# Patient Record
Sex: Female | Born: 1995 | Race: White | Hispanic: No | Marital: Single | Smoking: Never smoker
Health system: Southern US, Community
[De-identification: ages and names within clinical notes are randomized; demographics above are authoritative.]

## PROBLEM LIST (undated history)

## (undated) DIAGNOSIS — J189 Pneumonia, unspecified organism: Secondary | ICD-10-CM

## (undated) DIAGNOSIS — K5909 Other constipation: Secondary | ICD-10-CM

## (undated) DIAGNOSIS — J302 Other seasonal allergic rhinitis: Secondary | ICD-10-CM

## (undated) DIAGNOSIS — L0291 Cutaneous abscess, unspecified: Secondary | ICD-10-CM

---

## 2016-11-06 DIAGNOSIS — K353 Acute appendicitis with localized peritonitis: Secondary | ICD-10-CM | POA: Diagnosis not present

## 2016-11-06 DIAGNOSIS — R739 Hyperglycemia, unspecified: Secondary | ICD-10-CM | POA: Diagnosis present

## 2016-11-06 DIAGNOSIS — Z6837 Body mass index (BMI) 37.0-37.9, adult: Secondary | ICD-10-CM

## 2016-11-06 DIAGNOSIS — J302 Other seasonal allergic rhinitis: Secondary | ICD-10-CM | POA: Diagnosis present

## 2016-11-06 DIAGNOSIS — K5900 Constipation, unspecified: Secondary | ICD-10-CM | POA: Diagnosis present

## 2016-11-06 DIAGNOSIS — N739 Female pelvic inflammatory disease, unspecified: Secondary | ICD-10-CM | POA: Diagnosis present

## 2016-11-07 ENCOUNTER — Emergency Department (HOSPITAL_COMMUNITY): Payer: PRIVATE HEALTH INSURANCE

## 2016-11-07 ENCOUNTER — Inpatient Hospital Stay (HOSPITAL_COMMUNITY)
Admission: EM | Admit: 2016-11-07 | Discharge: 2016-11-14 | DRG: 373 | Disposition: A | Discharge status: Home / Self Care | Payer: PRIVATE HEALTH INSURANCE | Attending: Surgery | Admitting: Surgery

## 2016-11-07 ENCOUNTER — Encounter (HOSPITAL_COMMUNITY): Payer: Self-pay

## 2016-11-07 DIAGNOSIS — J302 Other seasonal allergic rhinitis: Secondary | ICD-10-CM

## 2016-11-07 DIAGNOSIS — K3532 Acute appendicitis with perforation and localized peritonitis, without abscess: Secondary | ICD-10-CM

## 2016-11-07 DIAGNOSIS — K5909 Other constipation: Secondary | ICD-10-CM

## 2016-11-07 DIAGNOSIS — K353 Acute appendicitis with localized peritonitis: Secondary | ICD-10-CM | POA: Diagnosis present

## 2016-11-07 DIAGNOSIS — Z6837 Body mass index (BMI) 37.0-37.9, adult: Secondary | ICD-10-CM | POA: Diagnosis not present

## 2016-11-07 DIAGNOSIS — N739 Female pelvic inflammatory disease, unspecified: Secondary | ICD-10-CM | POA: Diagnosis present

## 2016-11-07 DIAGNOSIS — R109 Unspecified abdominal pain: Secondary | ICD-10-CM

## 2016-11-07 DIAGNOSIS — K3533 Acute appendicitis with perforation and localized peritonitis, with abscess: Secondary | ICD-10-CM | POA: Diagnosis present

## 2016-11-07 DIAGNOSIS — R739 Hyperglycemia, unspecified: Secondary | ICD-10-CM | POA: Diagnosis present

## 2016-11-07 DIAGNOSIS — K651 Peritoneal abscess: Secondary | ICD-10-CM

## 2016-11-07 DIAGNOSIS — K5900 Constipation, unspecified: Secondary | ICD-10-CM | POA: Diagnosis present

## 2016-11-07 HISTORY — DX: Other seasonal allergic rhinitis: J30.2

## 2016-11-07 HISTORY — DX: Other constipation: K59.09

## 2016-11-07 LAB — COMPREHENSIVE METABOLIC PANEL
ALBUMIN: 4 g/dL (ref 3.5–5.0)
ALT: 16 U/L (ref 14–54)
AST: 17 U/L (ref 15–41)
Alkaline Phosphatase: 86 U/L (ref 38–126)
Anion gap: 12 (ref 5–15)
BILIRUBIN TOTAL: 1.1 mg/dL (ref 0.3–1.2)
BUN: 13 mg/dL (ref 6–20)
CHLORIDE: 104 mmol/L (ref 101–111)
CO2: 21 mmol/L — ABNORMAL LOW (ref 22–32)
CREATININE: 0.78 mg/dL (ref 0.44–1.00)
Calcium: 9.3 mg/dL (ref 8.9–10.3)
GFR calc Af Amer: >60 mL/min (ref >60)
GLUCOSE: 136 mg/dL — AB (ref 65–99)
Potassium: 4 mmol/L (ref 3.5–5.1)
Sodium: 137 mmol/L (ref 135–145)
Total Protein: 8 g/dL (ref 6.5–8.1)

## 2016-11-07 LAB — URINALYSIS, ROUTINE W REFLEX MICROSCOPIC
Bilirubin Urine: NEGATIVE
Glucose, UA: NEGATIVE mg/dL
Ketones, ur: 20 mg/dL — AB
LEUKOCYTES UA: NEGATIVE
Nitrite: NEGATIVE
PH: 6 (ref 5.0–8.0)
Protein, ur: NEGATIVE mg/dL

## 2016-11-07 LAB — CBC
HEMATOCRIT: 36.3 % (ref 36.0–46.0)
Hemoglobin: 12.3 g/dL (ref 12.0–15.0)
MCH: 29.5 pg (ref 26.0–34.0)
MCHC: 33.9 g/dL (ref 30.0–36.0)
MCV: 87.1 fL (ref 78.0–100.0)
PLATELETS: 248 10*3/uL (ref 150–400)
RBC: 4.17 MIL/uL (ref 3.87–5.11)
RDW: 13 % (ref 11.5–15.5)
WBC: 16.8 10*3/uL — AB (ref 4.0–10.5)

## 2016-11-07 LAB — I-STAT BETA HCG BLOOD, ED (MC, WL, AP ONLY)

## 2016-11-07 LAB — LIPASE, BLOOD: LIPASE: 18 U/L (ref 11–51)

## 2016-11-07 MED ORDER — FLUTICASONE PROPIONATE 50 MCG/ACT NA SUSP
1.0000 | Freq: Every day | NASAL | Status: DC
  Administered 2016-11-07 – 2016-11-14 (×8): 1 via NASAL
  Filled 2016-11-07: qty 16

## 2016-11-07 MED ORDER — DIPHENHYDRAMINE HCL 50 MG/ML IJ SOLN: 12.5000 mg | Freq: Four times a day (QID) | INTRAMUSCULAR | Status: DC | PRN

## 2016-11-07 MED ORDER — MORPHINE SULFATE (PF) 4 MG/ML IV SOLN
4.0000 mg | Freq: Once | INTRAVENOUS | Status: AC
  Administered 2016-11-07: 4 mg via INTRAVENOUS
  Filled 2016-11-07: qty 1

## 2016-11-07 MED ORDER — GLYCERIN (LAXATIVE) 2.1 G RE SUPP
1.0000 | RECTAL | Status: DC | PRN
  Filled 2016-11-07: qty 1

## 2016-11-07 MED ORDER — IOPAMIDOL (ISOVUE-300) INJECTION 61%
INTRAVENOUS | Status: AC
  Administered 2016-11-07: 100 mL via INTRAVENOUS
  Filled 2016-11-07: qty 100

## 2016-11-07 MED ORDER — DEXTROSE 5 % IV SOLN
2.0000 g | Freq: Once | INTRAVENOUS | Status: DC
  Administered 2016-11-07: 2 g via INTRAVENOUS
  Filled 2016-11-07: qty 2

## 2016-11-07 MED ORDER — SIMETHICONE 80 MG PO CHEW: 40.0000 mg | CHEWABLE_TABLET | Freq: Four times a day (QID) | ORAL | Status: DC | PRN

## 2016-11-07 MED ORDER — MAGIC MOUTHWASH
15.0000 mL | Freq: Four times a day (QID) | ORAL | Status: DC | PRN
  Administered 2016-11-11: 15 mL via ORAL
  Filled 2016-11-07 (×3): qty 15

## 2016-11-07 MED ORDER — ONDANSETRON HCL 4 MG/2ML IJ SOLN: 4.0000 mg | Freq: Four times a day (QID) | INTRAMUSCULAR | Status: DC | PRN

## 2016-11-07 MED ORDER — ACETAMINOPHEN 325 MG PO TABS
650.0000 mg | ORAL_TABLET | Freq: Four times a day (QID) | ORAL | Status: DC | PRN
  Administered 2016-11-07 – 2016-11-08 (×4): 650 mg via ORAL
  Filled 2016-11-07 (×4): qty 2

## 2016-11-07 MED ORDER — LACTATED RINGERS IV SOLN
INTRAVENOUS | Status: DC
  Administered 2016-11-07: 1000 mL via INTRAVENOUS
  Administered 2016-11-08: 100 mL/h via INTRAVENOUS
  Administered 2016-11-08 – 2016-11-09 (×2): via INTRAVENOUS

## 2016-11-07 MED ORDER — LACTATED RINGERS IV BOLUS (SEPSIS): 1000.0000 mL | Freq: Three times a day (TID) | INTRAVENOUS | Status: AC | PRN

## 2016-11-07 MED ORDER — ACETAMINOPHEN 650 MG RE SUPP: 650.0000 mg | Freq: Four times a day (QID) | RECTAL | Status: DC | PRN

## 2016-11-07 MED ORDER — BISACODYL 10 MG RE SUPP: 10.0000 mg | Freq: Two times a day (BID) | RECTAL | Status: DC | PRN

## 2016-11-07 MED ORDER — MENTHOL 3 MG MT LOZG: 1.0000 | LOZENGE | OROMUCOSAL | Status: DC | PRN

## 2016-11-07 MED ORDER — PIPERACILLIN-TAZOBACTAM 3.375 G IVPB
3.3750 g | Freq: Three times a day (TID) | INTRAVENOUS | Status: DC
  Administered 2016-11-07 – 2016-11-10 (×9): 3.375 g via INTRAVENOUS
  Filled 2016-11-07 (×10): qty 50

## 2016-11-07 MED ORDER — PIPERACILLIN-TAZOBACTAM 3.375 G IVPB
3.3750 g | Freq: Once | INTRAVENOUS | Status: AC
  Administered 2016-11-07: 3.375 g via INTRAVENOUS
  Filled 2016-11-07: qty 50

## 2016-11-07 MED ORDER — ALUM & MAG HYDROXIDE-SIMETH 200-200-20 MG/5ML PO SUSP: 30.0000 mL | Freq: Four times a day (QID) | ORAL | Status: DC | PRN

## 2016-11-07 MED ORDER — METHOCARBAMOL 1000 MG/10ML IJ SOLN
1000.0000 mg | Freq: Four times a day (QID) | INTRAVENOUS | Status: DC | PRN
  Filled 2016-11-07: qty 10

## 2016-11-07 MED ORDER — GLYCERIN (ADULT) 2 G RE SUPP: 1.0000 | RECTAL | Status: DC | PRN

## 2016-11-07 MED ORDER — HYDROMORPHONE HCL 1 MG/ML IJ SOLN
0.5000 mg | INTRAMUSCULAR | Status: DC | PRN
  Administered 2016-11-07 – 2016-11-09 (×9): 0.5 mg via INTRAVENOUS
  Filled 2016-11-07 (×9): qty 0.5

## 2016-11-07 MED ORDER — ONDANSETRON 4 MG PO TBDP: 4.0000 mg | ORAL_TABLET | Freq: Four times a day (QID) | ORAL | Status: DC | PRN

## 2016-11-07 MED ORDER — METOCLOPRAMIDE HCL 5 MG/ML IJ SOLN: 5.0000 mg | Freq: Four times a day (QID) | INTRAMUSCULAR | Status: DC | PRN

## 2016-11-07 MED ORDER — METOPROLOL TARTRATE 5 MG/5ML IV SOLN: 5.0000 mg | Freq: Four times a day (QID) | INTRAVENOUS | Status: DC | PRN

## 2016-11-07 MED ORDER — HYDRALAZINE HCL 20 MG/ML IJ SOLN: 10.0000 mg | INTRAMUSCULAR | Status: DC | PRN

## 2016-11-07 MED ORDER — IOPAMIDOL (ISOVUE-300) INJECTION 61%
100.0000 mL | Freq: Once | INTRAVENOUS | Status: AC | PRN
  Administered 2016-11-07: 100 mL via INTRAVENOUS

## 2016-11-07 MED ORDER — HYDRALAZINE HCL 20 MG/ML IJ SOLN: 5.0000 mg | Freq: Four times a day (QID) | INTRAMUSCULAR | Status: DC | PRN

## 2016-11-07 MED ORDER — LACTATED RINGERS IV BOLUS (SEPSIS)
1000.0000 mL | Freq: Once | INTRAVENOUS | Status: AC
  Administered 2016-11-07: 1000 mL via INTRAVENOUS

## 2016-11-07 MED ORDER — LIP MEDEX EX OINT
1.0000 "application " | TOPICAL_OINTMENT | Freq: Two times a day (BID) | CUTANEOUS | Status: DC
  Administered 2016-11-07 – 2016-11-14 (×13): 1 via TOPICAL
  Filled 2016-11-07 (×3): qty 7

## 2016-11-07 MED ORDER — PROCHLORPERAZINE EDISYLATE 5 MG/ML IJ SOLN: 5.0000 mg | INTRAMUSCULAR | Status: DC | PRN

## 2016-11-07 MED ORDER — SODIUM CHLORIDE 0.9 % IV BOLUS (SEPSIS)
1000.0000 mL | Freq: Once | INTRAVENOUS | Status: AC
Start: 2016-11-07 — End: 2016-11-07
  Administered 2016-11-07: 1000 mL via INTRAVENOUS

## 2016-11-07 MED ORDER — HYDROMORPHONE HCL 1 MG/ML IJ SOLN: 0.5000 mg | INTRAMUSCULAR | Status: DC | PRN

## 2016-11-07 MED ORDER — METRONIDAZOLE IN NACL 5-0.79 MG/ML-% IV SOLN
500.0000 mg | Freq: Once | INTRAVENOUS | Status: DC
  Filled 2016-11-07: qty 100

## 2016-11-07 MED ORDER — ENOXAPARIN SODIUM 40 MG/0.4ML ~~LOC~~ SOLN
40.0000 mg | SUBCUTANEOUS | Status: DC
  Administered 2016-11-07 – 2016-11-12 (×6): 40 mg via SUBCUTANEOUS
  Filled 2016-11-07 (×6): qty 0.4

## 2016-11-07 MED ORDER — DIPHENHYDRAMINE HCL 12.5 MG/5ML PO ELIX: 12.5000 mg | ORAL_SOLUTION | Freq: Four times a day (QID) | ORAL | Status: DC | PRN

## 2016-11-07 MED ORDER — METOPROLOL TARTRATE 12.5 MG HALF TABLET
12.5000 mg | ORAL_TABLET | Freq: Two times a day (BID) | ORAL | Status: DC | PRN
Start: 2016-11-07 — End: 2016-11-14

## 2016-11-07 MED ORDER — PHENOL 1.4 % MT LIQD: 2.0000 | OROMUCOSAL | Status: DC | PRN

## 2016-11-07 NOTE — ED Notes (Signed)
Pt from home with complaints of generalized lower abdominal pain. Per pt's mother, pt has history of frequent obstructions, but has never had to have interventions such as NG tube or surgery. [facility] who did an xray and determined pt had a blockage and to take milk of magnesia. Pt states symptoms have increased and are worse then she has ever experienced. Pt has no audible bowel sounds

## 2016-11-07 NOTE — ED Notes (Signed)
I-stat Beta <5.0 

## 2016-11-07 NOTE — ED Notes (Signed)
Pt is aware a urine sample is needed. 

## 2016-11-07 NOTE — H&P (Signed)
[facility]  [address] [place], [place] [address] Phone: [phone] FAX: [place]  1996-05-23 [id number]  CARE TEAM:  PCP: Pcp Not In System  Outpatient Care Team: Patient Care Team: Pcp Not In System as PCP - General  Inpatient Treatment Team: Treatment Team: Attending Provider: Nolon Nations, MD; Physician Assistant: Charlann Lange, PA-C; Consulting Physician: Nolon Nations, MD; Registered Nurse: Tiffany Kocher, RN; Registered Nurse: Mertha Baars, RN; Technician: Marjo Bicker, NT   This patient is a 21 y.o.female who presents today for surgical evaluation at the request of Dr. Regenia Skeeter, [facility].  Reason for evaluation: Perforated appendicitis  Pleasant morbidly obese female.  History of intermittent severe constipation.  Takes laxatives.  Mother recalls her bowels being slow all her life.  Was evaluated at [facility] when she was an infant.  Patient noted abdominal pain starting about four days ago.  Decreased appetite and nausea.  Pain became more intense and focal in the last 24 hours.  Seems to be primarily periumbilical bone but also seems to feel the worst on the right lower side.  Brought to the emergency room.  Examination and CT scan suspicious for appendicitis.  Strong suspicious for perforation or loculation.  Surgical consultation requested.   Assessment  Diane Mclaughlin  21 y.o. female       Problem List:  Principal Problem:   Acute appendicitis with perforation and peritoneal abscess Active Problems:   Morbid obesity with body mass index of 40.0-49.9 (HCC)   Constipation, chronic   Seasonal allergies   Acute appendicitis with probable perforation and small abscess  Plan  Admit  IV fluids  IV antibiotics - Zosyn given complex presentation  Repeat CT scan in 4-5 days to see if abscess is resolved or if it is worsening. I am concerned with the longer.  An obvious perforation that  surgery at this time is an increased risk of complications.  Ideally would allowed to cool down.  If develops an abscess, place a drain.  Allowed to cool down.  Then plan interval appendectomy six weeks later given her young age and complexity.  Her tachycardia has resolved and she seems more comfortable with IV medications.  Discussed with patient and her mother and emergency room nurse.  All in agreement with the plan.  -VTE prophylaxis- SCDs, etc -mobilize as tolerated to help recovery    Adin Hector, M.D., F.A.C.S. Gastrointestinal and Minimally Invasive Surgery [facility] [address] [place], [place] [address] [phone] Main / Paging   11/07/2016      History reviewed. No pertinent past medical history.  No past surgical history on file.  Social History   Social History  . Marital status: Single    Spouse name: N/A  . Number of children: N/A  . Years of education: N/A   Occupational History  . Not on file.   Social History Main Topics  . Smoking status: Not on file  . Smokeless tobacco: Not on file  . Alcohol use Not on file  . Drug use: Unknown  . Sexual activity: Not on file   Other Topics Concern  . Not on file   Social History Narrative  . No narrative on file    No family history on file.  Current Facility-Administered Medications  Medication Dose Route Frequency Provider Last Rate Last Dose  . acetaminophen (TYLENOL) tablet 650 mg  650 mg Oral Q6H PRN Michael Boston, MD       Or  . acetaminophen (TYLENOL) suppository 650 mg  650 mg Rectal Q6H PRN Michael Boston, MD      . alum & mag hydroxide-simeth (MAALOX/MYLANTA) 200-200-20 MG/5ML suspension 30 mL  30 mL Oral Q6H PRN Michael Boston, MD      . bisacodyl (DULCOLAX) suppository 10 mg  10 mg Rectal Q12H PRN Michael Boston, MD      . diphenhydrAMINE (BENADRYL) 12.5 MG/5ML elixir 12.5 mg  12.5 mg Oral Q6H PRN Michael Boston, MD       Or  . diphenhydrAMINE  (BENADRYL) injection 12.5 mg  12.5 mg Intravenous Q6H PRN Michael Boston, MD      . enoxaparin (LOVENOX) injection 40 mg  40 mg Subcutaneous Q24H Michael Boston, MD      . hydrALAZINE (APRESOLINE) injection 5-20 mg  5-20 mg Intravenous Q6H PRN Michael Boston, MD      . lactated ringers bolus 1,000 mL  1,000 mL Intravenous Once Michael Boston, MD      . lactated ringers bolus 1,000 mL  1,000 mL Intravenous Q8H PRN Michael Boston, MD      . lactated ringers infusion   Intravenous Continuous Michael Boston, MD      . lip balm (CARMEX) ointment 1 application  1 application Topical BID Michael Boston, MD      . magic mouthwash  15 mL Oral QID PRN Michael Boston, MD      . menthol-cetylpyridinium (CEPACOL) lozenge 3 mg  1 lozenge Oral PRN Michael Boston, MD      . methocarbamol (ROBAXIN) 1,000 mg in dextrose 5 % 50 mL IVPB  1,000 mg Intravenous Q6H PRN Michael Boston, MD      . metoCLOPramide (REGLAN) injection 5-10 mg  5-10 mg Intravenous Q6H PRN Michael Boston, MD      . metoprolol (LOPRESSOR) injection 5 mg  5 mg Intravenous Q6H PRN Michael Boston, MD      . metoprolol tartrate (LOPRESSOR) tablet 12.5 mg  12.5 mg Oral Q12H PRN Michael Boston, MD      . ondansetron (ZOFRAN-ODT) disintegrating tablet 4 mg  4 mg Oral Q6H PRN Michael Boston, MD       Or  . ondansetron [facility]) injection 4 mg  4 mg Intravenous Q6H PRN Michael Boston, MD      . phenol (CHLORASEPTIC) mouth spray 2 spray  2 spray Mouth/Throat PRN Michael Boston, MD      . piperacillin-tazobactam (ZOSYN) IVPB 3.375 g  3.375 g Intravenous Q8H Michael Boston, MD      . prochlorperazine (COMPAZINE) injection 5-10 mg  5-10 mg Intravenous Q4H PRN Michael Boston, MD      . simethicone [facility]) chewable tablet 40 mg  40 mg Oral Q6H PRN Michael Boston, MD       Current Outpatient Prescriptions  Medication Sig Dispense Refill  . fluticasone (FLONASE) 50 MCG/ACT nasal spray Place 1 spray into both nostrils daily.    Marland Kitchen glycerin adult 2 g suppository Place 1 suppository rectally as  needed for constipation.    . magnesium hydroxide (MILK OF MAGNESIA) 400 MG/5ML suspension Take 30 mLs by mouth daily as needed for mild constipation.    . naproxen sodium (ANAPROX) 220 MG tablet Take 220 mg by mouth 2 (two) times daily with a meal.       Allergies  Allergen Reactions  . Lactose Intolerance (Gi) Nausea Only    ROS:   All other  systems reviewed & are negative except per HPI or as noted below: Constitutional:  No fevers, chills, sweats.  Weight stable Eyes:  No vision changes, No discharge HENT:  No sore throats, nasal drainage Lymph: No neck swelling, No bruising easily Pulmonary:  No cough, productive sputum CV: No orthopnea, PND  Patient walks 60 minutes for about 2 miles without difficulty.  No exertional chest/neck/shoulder/arm pain. GI: No personal nor family history of GI/colon cancer, inflammatory bowel disease, irritable bowel syndrome, allergy such as Celiac Sprue, dietary/dairy problems, colitis, ulcers nor gastritis.  No recent sick contacts/gastroenteritis.  No travel outside the country.  No changes in diet. Renal: No UTIs, No hematuria Genital:  No drainage, bleeding, masses Musculoskeletal: No severe joint pain.  Good ROM major joints Skin:  No sores or lesions.  No rashes Heme/Lymph:  No easy bleeding.  No swollen lymph nodes Neuro: No focal weakness/numbness.  No seizures Psych: No suicidal ideation.  No hallucinations  BP 97/61 (BP Location: Left Arm)   Pulse (!) 105   Temp 97.7 F (36.5 C) (Oral)   Resp 18   Ht 5\' 7"  (1.702 m)   Wt 108 kg (238 lb)   LMP 11/06/2016 Comment: negative beta HCG 11/07/16  SpO2 97%   BMI 37.28 kg/m   Physical Exam: General: Pt awake/alert/oriented x4 in mild acute distress Eyes: PERRL, normal EOM. Sclera nonicteric Neuro: CN II-XII intact w/o focal sensory/motor deficits. Lymph: No head/neck/groin lymphadenopathy Psych:  No delerium/psychosis/paranoia HENT: Normocephalic, Mucus membranes moist.  No thrush Neck:  Supple, No tracheal deviation Chest: No pain.  Good respiratory excursion. CV:  Pulses intact.  Regular rhythm  Abdomen: Soft, Nondistended.  Vague periumbilical pain.  Much more focal right lower quadrant with some guarding.  Maximal tenderness and slightly inferior to McBurney's point which correlates with the low-lying appendix.  Rest of the quadrants are all nontender.  No incarcerated hernias.  Gen:  No inguinal hernias.  No inguinal lymphadenopathy.   Ext:  SCDs BLE.  No significant edema.  No cyanosis Skin: No petechiae / purpurea.  No major sores Musculoskeletal: No severe joint pain.  Good ROM major joints   Results:   Labs: Results for orders placed or performed during the hospital encounter of 11/07/16 (from the past 48 hour(s))  Lipase, blood     Status: None   Collection Time: 11/07/16 12:42 AM  Result Value Ref Range   Lipase 18 11 - 51 U/L  Comprehensive metabolic panel     Status: Abnormal   Collection Time: 11/07/16 12:42 AM  Result Value Ref Range   Sodium 137 135 - 145 mmol/L   Potassium 4.0 3.5 - 5.1 mmol/L   Chloride 104 101 - 111 mmol/L   CO2 21 (L) 22 - 32 mmol/L   Glucose, Bld 136 (H) 65 - 99 mg/dL   BUN 13 6 - 20 mg/dL   Creatinine, Ser 11/09/16 0.44 - 1.00 mg/dL   Calcium 9.3 8.9 - 0.59 mg/dL   Total Protein 8.0 6.5 - 8.1 g/dL   Albumin 4.0 3.5 - 5.0 g/dL   AST 17 15 - 41 U/L   ALT 16 14 - 54 U/L   Alkaline Phosphatase 86 38 - 126 U/L   Total Bilirubin 1.1 0.3 - 1.2 mg/dL   GFR calc non Af Amer >60 >60 mL/min   GFR calc Af Amer >60 >60 mL/min    Comment: (NOTE) The eGFR has been calculated using the CKD EPI equation. This calculation has not been  validated in all clinical situations. eGFR's persistently <60 mL/min signify possible Chronic Kidney Disease.    Anion gap 12 5 - 15  CBC     Status: Abnormal   Collection Time: 11/07/16 12:42 AM  Result Value Ref Range   WBC 16.8 (H) 4.0 - 10.5 K/uL   RBC 4.17 3.87 - 5.11 MIL/uL   Hemoglobin 12.3 12.0  - 15.0 g/dL   HCT 36.3 36.0 - 46.0 %   MCV 87.1 78.0 - 100.0 fL   MCH 29.5 26.0 - 34.0 pg   MCHC 33.9 30.0 - 36.0 g/dL   RDW 13.0 11.5 - 15.5 %   Platelets 248 150 - 400 K/uL  I-Stat Beta hCG blood, ED ([facility], [facility], [facility] only)     Status: None   Collection Time: 11/07/16  2:48 AM  Result Value Ref Range   I-stat hCG, quantitative <5.0 <5 mIU/mL   Comment 3            Comment:   GEST. AGE      CONC.  (mIU/mL)   <=1 WEEK        5 - 50     2 WEEKS       50 - 500     3 WEEKS       100 - 10,000     4 WEEKS     1,000 - 30,000        FEMALE AND NON-PREGNANT FEMALE:     LESS THAN 5 mIU/mL   Urinalysis, Routine w reflex microscopic     Status: Abnormal   Collection Time: 11/07/16  6:16 AM  Result Value Ref Range   Color, Urine YELLOW YELLOW   APPearance CLEAR CLEAR   Specific Gravity, Urine >1.046 (H) 1.005 - 1.030   pH 6.0 5.0 - 8.0   Glucose, UA NEGATIVE NEGATIVE mg/dL   Hgb urine dipstick LARGE (A) NEGATIVE   Bilirubin Urine NEGATIVE NEGATIVE   Ketones, ur 20 (A) NEGATIVE mg/dL   Protein, ur NEGATIVE NEGATIVE mg/dL   Nitrite NEGATIVE NEGATIVE   Leukocytes, UA NEGATIVE NEGATIVE   RBC / HPF 6-30 0 - 5 RBC/hpf   WBC, UA 0-5 0 - 5 WBC/hpf   Bacteria, UA RARE (A) NONE SEEN   Squamous Epithelial / LPF 0-5 (A) NONE SEEN   Mucous PRESENT     Imaging / Studies: Ct Abdomen Pelvis W Contrast  Result Date: 11/07/2016 CLINICAL DATA:  21 y/o F; increasing lower abdominal pain for 2 days with concern for obstruction. EXAM: CT ABDOMEN AND PELVIS WITH CONTRAST TECHNIQUE: Multidetector CT imaging of the abdomen and pelvis was performed using the standard protocol following bolus administration of intravenous contrast. CONTRAST:  100 cc Isovue-300 COMPARISON:  11/07/2016 abdomen radiographs. FINDINGS: Lower chest: No acute abnormality. Hepatobiliary: No focal liver abnormality is seen. No gallstones, gallbladder wall thickening, or biliary dilatation. Pancreas: Unremarkable. No pancreatic ductal  dilatation or surrounding inflammatory changes. Spleen: Normal in size without focal abnormality. Adrenals/Urinary Tract: Adrenal glands are unremarkable. Kidneys are normal, without renal calculi, focal lesion, or hydronephrosis. Bladder is unremarkable. Stomach/Bowel: The appendix is enlarged with enhancing ill-defined wall, extensive surrounding inflammatory changes of fat in the lower abdomen, and there is a small fluid collection associated with the appendiceal tip measuring 19 x 22 x 18 mm (AP x ML x CC series 2, image 69 and series 4, image 76). No evidence of bowel obstruction. Mildly dilated small bowel loops on prior radiograph are not appreciable on CT. Findings  probably represent reactive intermittent ileus. Wall thickening of the cecum and of loops of ileum in the region surrounding the inflamed appendix are likely reactive. Vascular/Lymphatic: No significant vascular findings are present. No enlarged abdominal or pelvic lymph nodes. Reproductive: Uterus and bilateral adnexa are unremarkable. Other: No abdominal hernia identified. Small volume of ascites and left-greater-than-right pericolic gutters. Musculoskeletal: No fracture is seen. IMPRESSION: 1. Acute appendicitis with extensive surrounding inflammatory changes in the lower abdomen and 22 mm abscess associated with the appendiceal tip indicating micro perforation. 2. No evidence for bowel obstruction. 3. Otherwise unremarkable CT of the abdomen and pelvis. These results were called by telephone at the time of interpretation on 11/07/2016 at 4:38 am to [place] , who verbally acknowledged these results. Electronically Signed   By: Kristine Garbe M.D.   On: 11/07/2016 04:41   Dg Abd Acute W/chest  Result Date: 11/07/2016 CLINICAL DATA:  21 y/o F; constipation, lower abdominal pain, and nausea for 72 hours. EXAM: DG ABDOMEN ACUTE W/ 1V CHEST COMPARISON:  None. FINDINGS: Mildly dilated loops of small bowel are present in the left  hemiabdomen with bowel fluid levels. No radiopaque calculi or other significant radiographic abnormality is seen. Heart size and mediastinal contours are within normal limits. Both lungs are clear. IMPRESSION: Mildly dilated loops of small bowel are present within the left hemiabdomen which may represent ileus or obstruction. Clear lungs. Electronically Signed   By: Kristine Garbe M.D.   On: 11/07/2016 03:08    Medications / Allergies: per chart  Antibiotics: Anti-infectives    Start     Dose/Rate Route Frequency Ordered Stop   11/07/16 0715  piperacillin-tazobactam (ZOSYN) IVPB 3.375 g     3.375 g 12.5 mL/hr over 240 Minutes Intravenous Every 8 hours 11/07/16 0714     11/07/16 0500  cefTRIAXone (ROCEPHIN) 2 g in dextrose 5 % 50 mL IVPB  Status:  Discontinued     2 g 100 mL/hr over 30 Minutes Intravenous  Once 11/07/16 0451 11/07/16 0458   11/07/16 0500  metroNIDAZOLE (FLAGYL) IVPB 500 mg  Status:  Discontinued     500 mg 100 mL/hr over 60 Minutes Intravenous  Once 11/07/16 0451 11/07/16 0458   11/07/16 0500  piperacillin-tazobactam (ZOSYN) IVPB 3.375 g     3.375 g 12.5 mL/hr over 240 Minutes Intravenous  Once 11/07/16 0459 11/07/16 0535        Note: Portions of this report may have been transcribed using voice recognition software. Every effort was made to ensure accuracy; however, inadvertent computerized transcription errors may be present.   Any transcriptional errors that result from this process are unintentional.    Adin Hector, M.D., F.A.C.S. Gastrointestinal and Minimally Invasive Surgery [facility] [address], [place], [place] [address] [phone] Main / Paging   11/07/2016

## 2016-11-07 NOTE — ED Provider Notes (Signed)
Medical screening examination/treatment/procedure(s) were conducted as a shared visit with non-physician practitioner(s) and myself.  I personally evaluated the patient during the encounter.   EKG Interpretation None       Patient with lower abd pain x 2 days. Found to have appendicitis with rupture and small abscess. Will give Zosyn per Dr. Michaell CowingGross, surgery to admit. Stable at this time   Pricilla LovelessScott Joshuajames Moehring, MD 11/07/16 2221

## 2016-11-07 NOTE — ED Provider Notes (Signed)
[facility]-EMERGENCY DEPT Provider Note   CSN: [id number] Arrival date & time: 11/06/16  2247 By signing my name below, I, Bridgette HabermannMaria Tan, attest that this documentation has been prepared under the direction and in the presence of Elpidio AnisShari Mariel Lukins, PA-C. Electronically Signed: Bridgette HabermannMaria Tan, ED Scribe. 11/07/16. 1:30 AM.  History   Chief Complaint Chief Complaint  Patient presents with  . Abdominal Pain    HPI The history is provided by the patient. No language interpreter was used.   HPI Comments: Diane Mclaughlin is a 21 y.o. female with no pertinent PMHx, who presents to the Emergency Department complaining of lower abdominal pain beginning two days ago, worsening one day ago. Pt also has associated constipation and mild difficulty urinating. Pain is exacerbated with deep breathing, coughing, and passing flatulence. Pt states she has frequent obstructions but has never had to have surgery or NG tubes placed. She reports her symptoms at this time feel similar but slightly worse. She usually uses Miralax for her symptoms but notes it did not help her this time. [facility] ago and had an x-ray done, determined that pt had a blockage and to take Milk of Magnesia. Pt has had Miralax, Milk of Magnesia and two Aleve with no relief to her symptoms. No h/o abdomen surgeries. Pt denies fever, chills, nausea, hematochezia, or any other associated symptoms.  No past medical history on file.  There are no active problems to display for this patient.   No past surgical history on file.  OB History    No data available       Home Medications    Prior to Admission medications   Not on File    Family History No family history on file.  Social History Social History  Substance Use Topics  . Smoking status: Not on file  . Smokeless tobacco: Not on file  . Alcohol use Not on file     Allergies   Lactose intolerance (gi)   Review of Systems Review of Systems    Constitutional: Negative for chills and fever.  Respiratory: Negative.   Cardiovascular: Negative.   Gastrointestinal: Positive for abdominal pain and constipation. Negative for blood in stool and nausea.  Genitourinary: Positive for difficulty urinating. Negative for dysuria and vaginal discharge.  Musculoskeletal: Negative.  Negative for myalgias.  Neurological: Negative.  Negative for weakness and headaches.     Physical Exam Updated Vital Signs BP 109/71 (BP Location: Left Arm)   Pulse (!) 125   Temp 97.7 F (36.5 C) (Oral)   Resp 18   Ht 5\' 7"  (1.702 m)   Wt 238 lb (108 kg)   LMP 11/06/2016   SpO2 100%   BMI 37.28 kg/m   Physical Exam  Constitutional: She appears well-developed and well-nourished.  HENT:  Head: Normocephalic.  Mouth/Throat: Oropharynx is clear and moist.  Eyes: Conjunctivae are normal.  Cardiovascular: Normal rate, regular rhythm and normal heart sounds.  Exam reveals no gallop and no friction rub.   No murmur heard. Pulmonary/Chest: Effort normal and breath sounds normal. No respiratory distress. She has no wheezes. She has no rales.  Abdominal: She exhibits no distension. Bowel sounds are decreased. There is tenderness.  Obese abdomen. Diffusely tender.  Musculoskeletal: Normal range of motion.  Neurological: She is alert.  Skin: Skin is warm and dry.  Psychiatric: She has a normal mood and affect. Her behavior is normal.  Nursing note and vitals reviewed.    ED Treatments /  Results  DIAGNOSTIC STUDIES: Oxygen Saturation is 100% on RA, normal by my interpretation.   COORDINATION OF CARE: 1:30 AM-Discussed next steps with pt. Pt verbalized understanding and is agreeable with the plan.   Labs (all labs ordered are listed, but only abnormal results are displayed) Labs Reviewed  COMPREHENSIVE METABOLIC PANEL - Abnormal; Notable for the following:       Result Value   CO2 21 (*)    Glucose, Bld 136 (*)    All other components within  normal limits  CBC - Abnormal; Notable for the following:    WBC 16.8 (*)    All other components within normal limits  LIPASE, BLOOD  URINALYSIS, ROUTINE W REFLEX MICROSCOPIC  POC URINE PREG, ED    EKG  EKG Interpretation None       Radiology No results found.  Procedures Procedures (including critical care time)  Medications Ordered in ED Medications - No data to display   Initial Impression / Assessment and Plan / ED Course  I have reviewed the triage vital signs and the nursing notes.  Pertinent labs & imaging results that were available during my care of the patient were reviewed by me and considered in my medical decision making (see chart for details).     Patient presents with lower abdominal pain, diffuse tenderness on exam. She has a leukocytosis, mild tachycardia. No vomiting in ED. She is in NAD.   Plain film shows air fluid levels and recommends a CT for definitive evaluation. CT scan showing acute appendicitis with abscess and microperforation. The patient is evaluated by Dr. Criss Alvine and Dr. Michaell Cowing (surgery) was consulted. IV Zosyn started. She will be seen and admitted by Dr. Michaell Cowing.  Final Clinical Impressions(s) / ED Diagnoses   Final diagnoses:  None   1. Acute appendicitis 2. Intra-abdominal abscess  New Prescriptions New Prescriptions   No medications on file   I personally performed the services described in this documentation, which was scribed in my presence. The recorded information has been reviewed and is accurate.      Elpidio Anis, PA-C 11/07/16 1610    Pricilla Loveless, MD 11/09/16 [phone]

## 2016-11-08 LAB — CREATININE, SERUM
Creatinine, Ser: 0.85 mg/dL (ref 0.44–1.00)
GFR calc Af Amer: >60 mL/min (ref >60)
GFR calc non Af Amer: >60 mL/min (ref >60)

## 2016-11-08 LAB — POTASSIUM: POTASSIUM: 3.8 mmol/L (ref 3.5–5.1)

## 2016-11-08 LAB — CBC
HCT: 32 % — ABNORMAL LOW (ref 36.0–46.0)
Hemoglobin: 10.6 g/dL — ABNORMAL LOW (ref 12.0–15.0)
MCH: 29.6 pg (ref 26.0–34.0)
MCHC: 33.1 g/dL (ref 30.0–36.0)
MCV: 89.4 fL (ref 78.0–100.0)
PLATELETS: 222 10*3/uL (ref 150–400)
RBC: 3.58 MIL/uL — ABNORMAL LOW (ref 3.87–5.11)
RDW: 13.5 % (ref 11.5–15.5)
WBC: 13.9 10*3/uL — ABNORMAL HIGH (ref 4.0–10.5)

## 2016-11-08 LAB — HIV ANTIBODY (ROUTINE TESTING W REFLEX): HIV SCREEN 4TH GENERATION: NONREACTIVE

## 2016-11-08 NOTE — Progress Notes (Signed)
Notified Romie Levee, MD that pt has a fever of 102.2 and off and on has pain when taking a deep breath. Administered tylenol 650 mg PO. No new orders at this time, continue to monitor.

## 2016-11-08 NOTE — Progress Notes (Signed)
Acute appendicitis with perforation and peritoneal abscess  Subjective: Feels somewhat better, no nausea  Objective: Vital signs in last 24 hours: Temp:  [98.8 F (37.1 C)-99.6 F (37.6 C)] 99.1 F (37.3 C) (03/30 0531) Pulse Rate:  [98-111] 98 (03/30 0531) Resp:  [16-18] 16 (03/30 0531) BP: (107-133)/(56-90) 112/64 (03/30 0531) SpO2:  [96 %-100 %] 100 % (03/30 0531) Last BM Date: 11/06/16  Intake/Output from previous day: 03/29 0701 - 03/30 0700 In: 3210 [P.O.:60; I.V.:2000; IV Piggyback:1150] Out: 625 [Urine:625] Intake/Output this shift: No intake/output data recorded.  General appearance: alert and cooperative GI: normal findings: soft, tender in RLQ  Lab Results:  Results for orders placed or performed during the hospital encounter of 11/07/16 (from the past 24 hour(s))  CBC     Status: Abnormal   Collection Time: 11/08/16  5:19 AM  Result Value Ref Range   WBC 13.9 (H) 4.0 - 10.5 K/uL   RBC 3.58 (L) 3.87 - 5.11 MIL/uL   Hemoglobin 10.6 (L) 12.0 - 15.0 g/dL   HCT 14.7 (L) 82.9 - 56.2 %   MCV 89.4 78.0 - 100.0 fL   MCH 29.6 26.0 - 34.0 pg   MCHC 33.1 30.0 - 36.0 g/dL   RDW 13.0 86.5 - 78.4 %   Platelets 222 150 - 400 K/uL  Creatinine, serum     Status: None   Collection Time: 11/08/16  5:19 AM  Result Value Ref Range   Creatinine, Ser 0.85 0.44 - 1.00 mg/dL   GFR calc non Af Amer >60 >60 mL/min   GFR calc Af Amer >60 >60 mL/min  Potassium     Status: None   Collection Time: 11/08/16  5:19 AM  Result Value Ref Range   Potassium 3.8 3.5 - 5.1 mmol/L     Studies/Results Radiology     MEDS, Scheduled . enoxaparin (LOVENOX) injection  40 mg Subcutaneous Q24H  . fluticasone  1 spray Each Nare Daily  . lip balm  1 application Topical BID  . piperacillin-tazobactam (ZOSYN)  IV  3.375 g Intravenous Q8H     Assessment: Acute appendicitis with perforation and peritoneal abscess Appears to be resolving with medical management  Plan: Cont IV  Zosyn CLD Recheck cbc in AM   LOS: 1 day    Vanita Panda, MD [facility] Surgery, [place] [phone]   11/08/2016 8:58 AM

## 2016-11-09 LAB — CBC
HEMATOCRIT: 32.5 % — AB (ref 36.0–46.0)
HEMOGLOBIN: 10.9 g/dL — AB (ref 12.0–15.0)
MCH: 29.8 pg (ref 26.0–34.0)
MCHC: 33.5 g/dL (ref 30.0–36.0)
MCV: 88.8 fL (ref 78.0–100.0)
PLATELETS: 222 10*3/uL (ref 150–400)
RBC: 3.66 MIL/uL — AB (ref 3.87–5.11)
RDW: 13.4 % (ref 11.5–15.5)
WBC: 11.7 10*3/uL — AB (ref 4.0–10.5)

## 2016-11-09 MED ORDER — HYDROMORPHONE HCL 1 MG/ML IJ SOLN
0.5000 mg | INTRAMUSCULAR | Status: DC | PRN
  Administered 2016-11-09: 0.5 mg via INTRAVENOUS
  Administered 2016-11-10 (×2): 1 mg via INTRAVENOUS
  Administered 2016-11-10: 0.5 mg via INTRAVENOUS
  Filled 2016-11-09 (×4): qty 1

## 2016-11-09 NOTE — Progress Notes (Signed)
Acute appendicitis with perforation and peritoneal abscess  Subjective: Feels somewhat better, no nausea.  Had a fever last night.  Had pain in her right shoulder at the same time  Objective: Vital signs in last 24 hours: Temp:  [98.9 F (37.2 C)-102.2 F (39 C)] 99.8 F (37.7 C) (03/31 0551) Pulse Rate:  [88-108] 88 (03/31 0551) Resp:  [16-17] 16 (03/31 0551) BP: (95-126)/(57-72) 95/57 (03/31 0551) SpO2:  [97 %-100 %] 100 % (03/31 0551) Last BM Date: 11/06/16  Intake/Output from previous day: 03/30 0701 - 03/31 0700 In: 3194.2 [P.O.:1680; I.V.:1364.2; IV Piggyback:150] Out: 3100 [Urine:3100] Intake/Output this shift: No intake/output data recorded.  General appearance: alert and cooperative GI: normal findings: soft, tender in RLQ  Lab Results:  Results for orders placed or performed during the hospital encounter of 11/07/16 (from the past 24 hour(s))  CBC     Status: Abnormal   Collection Time: 11/09/16  4:53 AM  Result Value Ref Range   WBC 11.7 (H) 4.0 - 10.5 K/uL   RBC 3.66 (L) 3.87 - 5.11 MIL/uL   Hemoglobin 10.9 (L) 12.0 - 15.0 g/dL   HCT 16.1 (L) 09.6 - 04.5 %   MCV 88.8 78.0 - 100.0 fL   MCH 29.8 26.0 - 34.0 pg   MCHC 33.5 30.0 - 36.0 g/dL   RDW 40.9 81.1 - 91.4 %   Platelets 222 150 - 400 K/uL     Studies/Results Radiology     MEDS, Scheduled . enoxaparin (LOVENOX) injection  40 mg Subcutaneous Q24H  . fluticasone  1 spray Each Nare Daily  . lip balm  1 application Topical BID  . piperacillin-tazobactam (ZOSYN)  IV  3.375 g Intravenous Q8H     Assessment: Acute appendicitis with perforation and peritoneal abscess Appears to be improving with medical management  Plan: Cont IV Zosyn FLD Recheck cbc in AM May need repeat CT to eval for abscess   LOS: 2 days    Vanita Panda, MD [facility] Surgery, [place] [phone]   11/09/2016 8:30 AM

## 2016-11-10 LAB — CBC
HCT: 31.5 % — ABNORMAL LOW (ref 36.0–46.0)
Hemoglobin: 10.4 g/dL — ABNORMAL LOW (ref 12.0–15.0)
MCH: 29.2 pg (ref 26.0–34.0)
MCHC: 33 g/dL (ref 30.0–36.0)
MCV: 88.5 fL (ref 78.0–100.0)
Platelets: 260 10*3/uL (ref 150–400)
RBC: 3.56 MIL/uL — AB (ref 3.87–5.11)
RDW: 13.3 % (ref 11.5–15.5)
WBC: 9.8 10*3/uL (ref 4.0–10.5)

## 2016-11-10 MED ORDER — AMOXICILLIN-POT CLAVULANATE 875-125 MG PO TABS
1.0000 | ORAL_TABLET | Freq: Two times a day (BID) | ORAL | Status: DC
  Administered 2016-11-10 – 2016-11-12 (×5): 1 via ORAL
  Filled 2016-11-10 (×5): qty 1

## 2016-11-10 MED ORDER — ORAL CARE MOUTH RINSE
15.0000 mL | Freq: Two times a day (BID) | OROMUCOSAL | Status: DC
  Administered 2016-11-10 – 2016-11-14 (×7): 15 mL via OROMUCOSAL

## 2016-11-10 MED ORDER — CHLORHEXIDINE GLUCONATE 0.12 % MT SOLN
15.0000 mL | Freq: Two times a day (BID) | OROMUCOSAL | Status: DC
  Administered 2016-11-10 – 2016-11-14 (×9): 15 mL via OROMUCOSAL
  Filled 2016-11-10 (×9): qty 15

## 2016-11-10 MED ORDER — HYDROCODONE-ACETAMINOPHEN 5-325 MG PO TABS
1.0000 | ORAL_TABLET | Freq: Four times a day (QID) | ORAL | Status: DC | PRN
  Administered 2016-11-10 – 2016-11-12 (×6): 1 via ORAL
  Filled 2016-11-10 (×6): qty 1

## 2016-11-10 NOTE — Progress Notes (Signed)
Acute appendicitis with perforation and peritoneal abscess  Subjective: Feels better, no nausea.  No further fevers. Tolerating liquids and having bowel function  Objective: Vital signs in last 24 hours: Temp:  [99.2 F (37.3 C)-100.5 F (38.1 C)] 99.2 F (37.3 C) (04/01 0423) Pulse Rate:  [86-97] 90 (04/01 0423) Resp:  [16] 16 (04/01 0423) BP: (114-124)/(73-76) 114/74 (04/01 0423) SpO2:  [95 %-98 %] 97 % (04/01 0423) Last BM Date: 11/09/16  Intake/Output from previous day: 03/31 0701 - 04/01 0700 In: 1030 [P.O.:480; I.V.:350; IV Piggyback:200] Out: 2200 [Urine:2200] Intake/Output this shift: No intake/output data recorded.  General appearance: alert and cooperative GI: normal findings: soft, tender in RLQ  Lab Results:  Results for orders placed or performed during the hospital encounter of 11/07/16 (from the past 24 hour(s))  CBC     Status: Abnormal   Collection Time: 11/10/16  5:36 AM  Result Value Ref Range   WBC 9.8 4.0 - 10.5 K/uL   RBC 3.56 (L) 3.87 - 5.11 MIL/uL   Hemoglobin 10.4 (L) 12.0 - 15.0 g/dL   HCT 16.1 (L) 09.6 - 04.5 %   MCV 88.5 78.0 - 100.0 fL   MCH 29.2 26.0 - 34.0 pg   MCHC 33.0 30.0 - 36.0 g/dL   RDW 40.9 81.1 - 91.4 %   Platelets 260 150 - 400 K/uL     Studies/Results Radiology     MEDS, Scheduled . amoxicillin-clavulanate  1 tablet Oral Q12H  . chlorhexidine  15 mL Mouth Rinse BID  . enoxaparin (LOVENOX) injection  40 mg Subcutaneous Q24H  . fluticasone  1 spray Each Nare Daily  . lip balm  1 application Topical BID  . mouth rinse  15 mL Mouth Rinse q12n4p     Assessment: Acute appendicitis with perforation and peritoneal abscess Appears to be improving with medical management  Plan: D/C IV Zosyn and start Augmentin Reg diet today  If further fevers, may need repeat CT to eval for abscess   LOS: 3 days    Vanita Panda, MD [facility] Surgery, [place] [phone]   11/10/2016 8:20 AM

## 2016-11-11 NOTE — Progress Notes (Signed)
[facility] Progress Note     Subjective: CC intermittent abdominal pain. Abdominal pain is improved. Denies fever/chills. Tolerating regular diet and having bowel function. Last BM was 3/31.   Objective: Vital signs in last 24 hours: Temp:  [98.2 F (36.8 C)-99.2 F (37.3 C)] 98.2 F (36.8 C) (04/02 0440) Pulse Rate:  [95-113] 95 (04/02 0440) Resp:  [16-18] 18 (04/02 0440) BP: (98-121)/(58-74) 98/58 (04/02 0440) SpO2:  [95 %-100 %] 100 % (04/02 0440) Last BM Date: 11/10/16  Intake/Output from previous day: 04/01 0701 - 04/02 0700 In: 720 [P.O.:720] Out: 300 [Urine:300] Intake/Output this shift: Total I/O In: 360 [P.O.:360] Out: 0   PE: Gen:  Alert, NAD, pleasant Card:  Regular rate and rhythm Pulm:  Non-labored, clear to auscultation bilaterally Abd: Soft, obese, TTP RLQ without rebound tenderness or guarding, non-distended, bowel sounds hypoactive  Ext:  No erythema, edema, or tenderness  Lab Results:   Recent Labs  11/09/16 0453 11/10/16 0536  WBC 11.7* 9.8  HGB 10.9* 10.4*  HCT 32.5* 31.5*  PLT 222 260   BMET No results for input(s): NA, K, CL, CO2, GLUCOSE, BUN, CREATININE, CALCIUM in the last 72 hours. PT/INR No results for input(s): LABPROT, INR in the last 72 hours. CMP     Component Value Date/Time   NA 137 11/07/2016 0042   K 3.8 11/08/2016 0519   CL 104 11/07/2016 0042   CO2 21 (L) 11/07/2016 0042   GLUCOSE 136 (H) 11/07/2016 0042   BUN 13 11/07/2016 0042   CREATININE 0.85 11/08/2016 0519   CALCIUM 9.3 11/07/2016 0042   PROT 8.0 11/07/2016 0042   ALBUMIN 4.0 11/07/2016 0042   AST 17 11/07/2016 0042   ALT 16 11/07/2016 0042   ALKPHOS 86 11/07/2016 0042   BILITOT 1.1 11/07/2016 0042   GFRNONAA >60 11/08/2016 0519   GFRAA >60 11/08/2016 0519   Lipase     Component Value Date/Time   LIPASE 18 11/07/2016 0042   Anti-infectives: Anti-infectives    Start     Dose/Rate Route Frequency Ordered Stop   11/10/16 1000   amoxicillin-clavulanate (AUGMENTIN) 875-125 MG per tablet 1 tablet     1 tablet Oral Every 12 hours 11/10/16 0819 11/20/16 0959   11/07/16 1200  piperacillin-tazobactam (ZOSYN) IVPB 3.375 g  Status:  Discontinued     3.375 g 12.5 mL/hr over 240 Minutes Intravenous Every 8 hours 11/07/16 0714 11/10/16 0819   11/07/16 0500  cefTRIAXone (ROCEPHIN) 2 g in dextrose 5 % 50 mL IVPB  Status:  Discontinued     2 g 100 mL/hr over 30 Minutes Intravenous  Once 11/07/16 0451 11/07/16 0458   11/07/16 0500  metroNIDAZOLE (FLAGYL) IVPB 500 mg  Status:  Discontinued     500 mg 100 mL/hr over 60 Minutes Intravenous  Once 11/07/16 0451 11/07/16 0458   11/07/16 0500  piperacillin-tazobactam (ZOSYN) IVPB 3.375 g     3.375 g 12.5 mL/hr over 240 Minutes Intravenous  Once 11/07/16 0459 11/07/16 0535     Assessment/Plan Acute appendicitis with perforation and peritoneal abscess -  Appears to be improving with medical management -  Leukocytosis resolved  -  Continue PO abx and regular diet -  PRN Miralax   FEN: Regular diet ID: Zosyn 3/30-4/1, Augmentin 4/1 >>  VTE: Lovenox, SCD's   Plan: continue regular diet, CBC in AM  If patient remains clinically improved tomorrow will discharge home with outpatient follow-up. If fever/leukocytosis recurs, or patient deteriorates clinically, will repeat CT Abd/pelvis tomorrow.  LOS: 4 days    Adam Phenix , [facility] Surgery 11/11/2016, 10:12 AM Pager: [phone] Consults: [phone] Mon-Fri 7:00 am-4:30 pm Sat-Sun 7:00 am-11:30 am

## 2016-11-12 ENCOUNTER — Inpatient Hospital Stay (HOSPITAL_COMMUNITY): Payer: PRIVATE HEALTH INSURANCE

## 2016-11-12 ENCOUNTER — Encounter (HOSPITAL_COMMUNITY): Payer: Self-pay | Admitting: Radiology

## 2016-11-12 LAB — CBC
HCT: 31.5 % — ABNORMAL LOW (ref 36.0–46.0)
HEMOGLOBIN: 10.3 g/dL — AB (ref 12.0–15.0)
MCH: 28.9 pg (ref 26.0–34.0)
MCHC: 32.7 g/dL (ref 30.0–36.0)
MCV: 88.5 fL (ref 78.0–100.0)
Platelets: 289 10*3/uL (ref 150–400)
RBC: 3.56 MIL/uL — AB (ref 3.87–5.11)
RDW: 13.2 % (ref 11.5–15.5)
WBC: 10.1 10*3/uL (ref 4.0–10.5)

## 2016-11-12 MED ORDER — IOPAMIDOL (ISOVUE-300) INJECTION 61%
15.0000 mL | Freq: Once | INTRAVENOUS | Status: DC | PRN
  Administered 2016-11-12: 08:00:00 via ORAL
  Filled 2016-11-12: qty 30

## 2016-11-12 MED ORDER — IOPAMIDOL (ISOVUE-300) INJECTION 61%
INTRAVENOUS | Status: AC
  Filled 2016-11-12: qty 30

## 2016-11-12 MED ORDER — IOPAMIDOL (ISOVUE-300) INJECTION 61%
INTRAVENOUS | Status: AC
  Filled 2016-11-12: qty 100

## 2016-11-12 MED ORDER — POLYETHYLENE GLYCOL 3350 17 G PO PACK
17.0000 g | PACK | Freq: Every day | ORAL | Status: DC
  Administered 2016-11-12 – 2016-11-14 (×2): 17 g via ORAL
  Filled 2016-11-12 (×2): qty 1

## 2016-11-12 MED ORDER — PIPERACILLIN-TAZOBACTAM 3.375 G IVPB
3.3750 g | Freq: Three times a day (TID) | INTRAVENOUS | Status: DC
  Administered 2016-11-12 – 2016-11-14 (×5): 3.375 g via INTRAVENOUS
  Filled 2016-11-12 (×7): qty 50

## 2016-11-12 MED ORDER — IOPAMIDOL (ISOVUE-300) INJECTION 61%
100.0000 mL | Freq: Once | INTRAVENOUS | Status: AC | PRN
  Administered 2016-11-12: 100 mL via INTRAVENOUS

## 2016-11-12 MED ORDER — METHOCARBAMOL 1000 MG/10ML IJ SOLN
500.0000 mg | Freq: Three times a day (TID) | INTRAVENOUS | Status: DC
  Administered 2016-11-12 – 2016-11-14 (×6): 500 mg via INTRAVENOUS
  Filled 2016-11-12: qty 5
  Filled 2016-11-12: qty 550
  Filled 2016-11-12 (×3): qty 5
  Filled 2016-11-12 (×2): qty 550
  Filled 2016-11-12: qty 5
  Filled 2016-11-12: qty 550

## 2016-11-12 NOTE — Consult Note (Signed)
Chief Complaint: Patient was seen in consultation today for abdominal pain  Referring Physician(s):  Dr. Romie Levee  Supervising Physician: Ruel Favors  Patient Status: [facility] - In-pt  History of Present Illness: Diane Mclaughlin is a 21 y.o. female with past medical history of constipation who was admitted 11/07/16 with acute abdominal pain.   CT Abd/Pelvis showed: 1. Acute appendicitis with extensive surrounding inflammatory changes in the lower abdomen and 22 mm abscess associated with the appendiceal tip indicating micro perforation. 2. No evidence for bowel obstruction.  Patient has been managed conservatively with plans for appendectomy at a later date.  Repeat imaging obtained today and shows: Findings consistent with perforated appendicitis with interval development of multiple pelvic abscesses and extensive inflammatory changes. Inflammation of the cecum is also noted.  IR consulted for possible aspiration and drainage of abdominal abscesses.   Past Medical History:  Diagnosis Date  . Constipation, chronic 11/07/2016  . Seasonal allergies 11/07/2016    History reviewed. No pertinent surgical history.  Allergies: Lactose intolerance (gi)  Medications: Prior to Admission medications   Medication Sig Start Date End Date Taking? Authorizing Provider  fluticasone (FLONASE) 50 MCG/ACT nasal spray Place 1 spray into both nostrils daily.   Yes Historical Provider, MD  glycerin adult 2 g suppository Place 1 suppository rectally as needed for constipation.   Yes Historical Provider, MD  magnesium hydroxide (MILK OF MAGNESIA) 400 MG/5ML suspension Take 30 mLs by mouth daily as needed for mild constipation.   Yes Historical Provider, MD  naproxen sodium (ANAPROX) 220 MG tablet Take 220 mg by mouth 2 (two) times daily with a meal.   Yes Historical Provider, MD     History reviewed. No pertinent family history.  Social History   Social History  . Marital status: Single    Spouse name: N/A  . Number of children: N/A  . Years of education: N/A   Social History Main Topics  . Smoking status: Never Smoker  . Smokeless tobacco: Never Used  . Alcohol use None  . Drug use: Unknown  . Sexual activity: Not Asked   Other Topics Concern  . None   Social History Narrative  . None    Review of Systems  Constitutional: Negative for fatigue and fever.  Respiratory: Negative for cough and shortness of breath.   Cardiovascular: Negative for chest pain.  Gastrointestinal: Positive for abdominal pain and constipation.  Psychiatric/Behavioral: Negative for behavioral problems and confusion.    Vital Signs: BP 110/64 (BP Location: Right Arm)   Pulse 89   Temp 99.2 F (37.3 C) (Oral)   Resp 20   Ht  (1.702 m)   Wt 238 lb (108 kg)   LMP 11/06/2016 Comment: negative beta HCG 11/07/16  SpO2 98%   BMI 37.28 kg/m   Physical Exam  Constitutional: She is oriented to person, place, and time. She appears well-developed.  Cardiovascular: Normal rate, regular rhythm and normal heart sounds.   Pulmonary/Chest: Effort normal and breath sounds normal. No respiratory distress.  Abdominal: There is tenderness.  Neurological: She is alert and oriented to person, place, and time.  Psychiatric: She has a normal mood and affect. Her behavior is normal. Judgment and thought content normal.  Nursing note and vitals reviewed.   Mallampati Score:  MD Evaluation Airway: WNL Heart: WNL Abdomen: WNL Chest/ Lungs: WNL ASA  Classification: 3 Mallampati/Airway Score: Two  Imaging: Ct Abdomen Pelvis W Contrast  Result Date: 11/12/2016 CLINICAL DATA:  History of perforated  appendicitis. Persistent right lower quadrant abdominal pain. EXAM: CT ABDOMEN AND PELVIS WITH CONTRAST TECHNIQUE: Multidetector CT imaging of the abdomen and pelvis was performed using the standard protocol following bolus administration of intravenous contrast. CONTRAST:  ISOVUE-300 IOPAMIDOL  (ISOVUE-300) INJECTION 61% COMPARISON:  CT scan 11/07/2016 FINDINGS: Lower chest: Small right-sided pleural effusion with overlying atelectasis. Hepatobiliary: No focal hepatic lesions or intrahepatic biliary dilatation. The gallbladder is normal. No common bile duct dilatation. Pancreas: No mass, inflammation or ductal dilatation. Spleen: Normal size.  No focal lesions. Adrenals/Urinary Tract: The adrenal glands and kidneys are unremarkable. No renal, ureteral or bladder calculi or mass. Stomach/Bowel: The stomach, duodenum, small bowel colon are grossly normal. There is some inflammation surrounding the cecum along with inflammatory phlegmon. No findings for obstruction or leaking oral contrast. No free air. The Appendix is again demonstrated and findings consistent with prior perforation. Vascular/Lymphatic: The aorta and branch vessels are normal. The major venous structures are patent. Small scattered mesenteric and retroperitoneal lymph nodes are again demonstrated. No mass or overt adenopathy. Reproductive: The uterus and ovaries are grossly normal. Other: Interval development of multiple pelvic fluid collections consistent with abscesses. There is a 3.5 x 3.0 cm rounded abscess in the left pelvis just anterior to the iliopsoas muscle. Complex, possibly connected, abscesses in the right pelvis surrounding the uterus and right ovary and adjacent to the cecum. The largest component measures 3.5 cm. Significant surrounding inflammatory phlegmon. Musculoskeletal: No significant bony findings. IMPRESSION: Findings consistent with perforated appendicitis with interval development of multiple pelvic abscesses and extensive inflammatory changes. Inflammation of the cecum is also noted. No bowel obstruction or leaking oral contrast. Electronically Signed   By: Rudie Meyer M.D.   On: 11/12/2016 12:21   Ct Abdomen Pelvis W Contrast  Result Date: 11/07/2016 CLINICAL DATA:  21 y/o F; increasing lower abdominal pain  for 2 days with concern for obstruction. EXAM: CT ABDOMEN AND PELVIS WITH CONTRAST TECHNIQUE: Multidetector CT imaging of the abdomen and pelvis was performed using the standard protocol following bolus administration of intravenous contrast. CONTRAST:  100 cc Isovue-300 COMPARISON:  11/07/2016 abdomen radiographs. FINDINGS: Lower chest: No acute abnormality. Hepatobiliary: No focal liver abnormality is seen. No gallstones, gallbladder wall thickening, or biliary dilatation. Pancreas: Unremarkable. No pancreatic ductal dilatation or surrounding inflammatory changes. Spleen: Normal in size without focal abnormality. Adrenals/Urinary Tract: Adrenal glands are unremarkable. Kidneys are normal, without renal calculi, focal lesion, or hydronephrosis. Bladder is unremarkable. Stomach/Bowel: The appendix is enlarged with enhancing ill-defined wall, extensive surrounding inflammatory changes of fat in the lower abdomen, and there is a small fluid collection associated with the appendiceal tip measuring 19 x 22 x 18 mm (AP x ML x CC series 2, image 69 and series 4, image 76). No evidence of bowel obstruction. Mildly dilated small bowel loops on prior radiograph are not appreciable on CT. Findings probably represent reactive intermittent ileus. Wall thickening of the cecum and of loops of ileum in the region surrounding the inflamed appendix are likely reactive. Vascular/Lymphatic: No significant vascular findings are present. No enlarged abdominal or pelvic lymph nodes. Reproductive: Uterus and bilateral adnexa are unremarkable. Other: No abdominal hernia identified. Small volume of ascites and left-greater-than-right pericolic gutters. Musculoskeletal: No fracture is seen. IMPRESSION: 1. Acute appendicitis with extensive surrounding inflammatory changes in the lower abdomen and 22 mm abscess associated with the appendiceal tip indicating micro perforation. 2. No evidence for bowel obstruction. 3. Otherwise unremarkable CT  of the abdomen and pelvis. These results were  called by telephone at the time of interpretation on 11/07/2016 at 4:38 am to PA [facility] , who verbally acknowledged these results. Electronically Signed   By: Mitzi Hansen M.D.   On: 11/07/2016 04:41   Dg Abd Acute W/chest  Result Date: 11/07/2016 CLINICAL DATA:  21 y/o F; constipation, lower abdominal pain, and nausea for 72 hours. EXAM: DG ABDOMEN ACUTE W/ 1V CHEST COMPARISON:  None. FINDINGS: Mildly dilated loops of small bowel are present in the left hemiabdomen with bowel fluid levels. No radiopaque calculi or other significant radiographic abnormality is seen. Heart size and mediastinal contours are within normal limits. Both lungs are clear. IMPRESSION: Mildly dilated loops of small bowel are present within the left hemiabdomen which may represent ileus or obstruction. Clear lungs. Electronically Signed   By: Mitzi Hansen M.D.   On: 11/07/2016 03:08    Labs:  CBC:  Recent Labs  11/08/16 0519 11/09/16 0453 11/10/16 0536 11/12/16 0435  WBC 13.9* 11.7* 9.8 10.1  HGB 10.6* 10.9* 10.4* 10.3*  HCT 32.0* 32.5* 31.5* 31.5*  PLT 222 222 260 289    COAGS: No results for input(s): INR, APTT in the last 8760 hours.  BMP:  Recent Labs  11/07/16 0042 11/08/16 0519  NA 137  --   K 4.0 3.8  CL 104  --   CO2 21*  --   GLUCOSE 136*  --   BUN 13  --   CALCIUM 9.3  --   CREATININE 0.78 0.85  GFRNONAA >60 >60  GFRAA >60 >60    LIVER FUNCTION TESTS:  Recent Labs  11/07/16 0042  BILITOT 1.1  AST 17  ALT 16  ALKPHOS 86  PROT 8.0  ALBUMIN 4.0    TUMOR MARKERS: No results for input(s): AFPTM, CEA, CA199, CHROMGRNA in the last 8760 hours.  Assessment and Plan: Abdominal abscess, perforated appendicitis IR consulted for aspiration and drainage of abdominal fluid collections.  She has not been NPO today.  She last received lovenox this AM.  Reviewed case with Dr. Miles Costain.  Patient may need more than 1  drain. Have placed orders for NPO tomorrow and to hold blood thinners.  Anticipate procedure in IR tomorrow.  Risks and benefits discussed with the patient including bleeding, infection, damage to adjacent structures, bowel perforation/fistula connection, and sepsis. All of the patient's questions were answered, patient is agreeable to proceed. Consent signed and in chart.  Thank you for this interesting consult.  I greatly enjoyed meeting Zanylah Hardie and look forward to participating in their care.  A copy of this report was sent to the requesting provider on this date.  Electronically Signed: Hoyt Koch 11/12/2016, 5:49 PM   I spent a total of 40 Minutes    in face to face in clinical consultation, greater than 50% of which was counseling/coordinating care for abdominal abscess.

## 2016-11-12 NOTE — Progress Notes (Signed)
[facility] Progress Note     Subjective: CC shoulder/upper back pain that is worse with breathing and movement. Abdominal pain is improving but patient still endorses dull RLQ pain with movement. She reports 2, very small BM's yesterday - denies hematochezia or melena. She is tolerating PO intake without nausea/vomiting. Denies fevers.  Per patient request, I called her mother to discuss the patients plan of care. All questions were answered.   Objective: Vital signs in last 24 hours: Temp:  [98.4 F (36.9 C)-98.9 F (37.2 C)] 98.4 F (36.9 C) (04/03 0605) Pulse Rate:  [88-95] 95 (04/03 0605) Resp:  [16-18] 16 (04/03 0605) BP: (103-114)/(60-70) 114/70 (04/03 0605) SpO2:  [98 %] 98 % (04/03 0605) Last BM Date: 11/11/16  Intake/Output from previous day: 04/02 0701 - 04/03 0700 In: 960 [P.O.:960] Out: 0  Intake/Output this shift: No intake/output data recorded.  PE: Gen:  Alert, NAD, pleasant Card:  Regular rate and rhythm Pulm:  Non-labored, clear to auscultation bilaterally Abd: Soft, obese, mild TTP RLQ without rebound tenderness or guarding, non-distended, bowel sounds present Ext:  No erythema, edema, or tenderness; R shoulder with full active and passive ROM, non-tender. No swelling or erythema   Lab Results:   Recent Labs  11/10/16 0536 11/12/16 0435  WBC 9.8 10.1  HGB 10.4* 10.3*  HCT 31.5* 31.5*  PLT 260 289   BMET No results for input(s): NA, K, CL, CO2, GLUCOSE, BUN, CREATININE, CALCIUM in the last 72 hours. PT/INR No results for input(s): LABPROT, INR in the last 72 hours. CMP     Component Value Date/Time   NA 137 11/07/2016 0042   K 3.8 11/08/2016 0519   CL 104 11/07/2016 0042   CO2 21 (L) 11/07/2016 0042   GLUCOSE 136 (H) 11/07/2016 0042   BUN 13 11/07/2016 0042   CREATININE 0.85 11/08/2016 0519   CALCIUM 9.3 11/07/2016 0042   PROT 8.0 11/07/2016 0042   ALBUMIN 4.0 11/07/2016 0042   AST 17 11/07/2016 0042   ALT 16 11/07/2016  0042   ALKPHOS 86 11/07/2016 0042   BILITOT 1.1 11/07/2016 0042   GFRNONAA >60 11/08/2016 0519   GFRAA >60 11/08/2016 0519   Lipase     Component Value Date/Time   LIPASE 18 11/07/2016 0042       Studies/Results: No results found.  Anti-infectives: Anti-infectives    Start     Dose/Rate Route Frequency Ordered Stop   11/10/16 1000  amoxicillin-clavulanate (AUGMENTIN) 875-125 MG per tablet 1 tablet     1 tablet Oral Every 12 hours 11/10/16 0819 11/20/16 0959   11/07/16 1200  piperacillin-tazobactam (ZOSYN) IVPB 3.375 g  Status:  Discontinued     3.375 g 12.5 mL/hr over 240 Minutes Intravenous Every 8 hours 11/07/16 0714 11/10/16 0819   11/07/16 0500  cefTRIAXone (ROCEPHIN) 2 g in dextrose 5 % 50 mL IVPB  Status:  Discontinued     2 g 100 mL/hr over 30 Minutes Intravenous  Once 11/07/16 0451 11/07/16 0458   11/07/16 0500  metroNIDAZOLE (FLAGYL) IVPB 500 mg  Status:  Discontinued     500 mg 100 mL/hr over 60 Minutes Intravenous  Once 11/07/16 0451 11/07/16 0458   11/07/16 0500  piperacillin-tazobactam (ZOSYN) IVPB 3.375 g     3.375 g 12.5 mL/hr over 240 Minutes Intravenous  Once 11/07/16 0459 11/07/16 0535     Assessment/Plan Acute appendicitis with perforation and peritoneal abscess -  Appears to be improving with medical management -  Leukocytosis resolved  -  Continue PO abx and regular diet -  PRN Miralax   FEN: Regular diet ID: Zosyn 3/30-4/1, Augmentin 4/1 >>  VTE: Lovenox, SCD's   Plan: CT abdomen/pelvis to assess if abscess is resolved/worsening.  Heat packs/robaxin for right shoulder/back pain ------- CHART CHECK 2:40 PM -- CT SCAN significant for development of multiple pelvic abscesses measuring up to 3.5 cm. Will D/C Augmentin and re-start IV Zosyn. Will review CT with MD and call IR to discuss percutaneous drainage.     LOS: 5 days    Adam Phenix , [facility] Surgery 11/12/2016, 7:24 AM Pager: [phone] Consults:  [phone] Mon-Fri 7:00 am-4:30 pm Sat-Sun 7:00 am-11:30 am

## 2016-11-13 ENCOUNTER — Inpatient Hospital Stay (HOSPITAL_COMMUNITY): Payer: PRIVATE HEALTH INSURANCE

## 2016-11-13 LAB — PROTIME-INR
INR: 1.08
PROTHROMBIN TIME: 14.1 s (ref 11.4–15.2)

## 2016-11-13 MED ORDER — ENOXAPARIN SODIUM 40 MG/0.4ML ~~LOC~~ SOLN
40.0000 mg | SUBCUTANEOUS | Status: DC
  Administered 2016-11-13: 40 mg via SUBCUTANEOUS
  Filled 2016-11-13: qty 0.4

## 2016-11-13 MED ORDER — FENTANYL CITRATE (PF) 100 MCG/2ML IJ SOLN
INTRAMUSCULAR | Status: AC
Start: 2016-11-13 — End: 2016-11-14
  Filled 2016-11-13: qty 4

## 2016-11-13 MED ORDER — SODIUM CHLORIDE 0.9 % IV SOLN
INTRAVENOUS | Status: AC | PRN
  Administered 2016-11-13: 30 mL/h via INTRAVENOUS

## 2016-11-13 MED ORDER — MIDAZOLAM HCL 2 MG/2ML IJ SOLN
INTRAMUSCULAR | Status: AC | PRN
  Administered 2016-11-13 (×2): 1 mg via INTRAVENOUS

## 2016-11-13 MED ORDER — MIDAZOLAM HCL 2 MG/2ML IJ SOLN
INTRAMUSCULAR | Status: AC
  Filled 2016-11-13: qty 4

## 2016-11-13 MED ORDER — FENTANYL CITRATE (PF) 100 MCG/2ML IJ SOLN
INTRAMUSCULAR | Status: AC | PRN
  Administered 2016-11-13 (×3): 50 ug via INTRAVENOUS

## 2016-11-13 NOTE — Progress Notes (Signed)
[facility] Progress Note     Subjective: Cc intermittent abdominal pain and some cramping with BM. + BM yesterday. Denies nausea or vomiting. Mobilizing.  Objective: Vital signs in last 24 hours: Temp:  [98.2 F (36.8 C)-99.2 F (37.3 C)] 98.3 F (36.8 C) (04/04 0511) Pulse Rate:  [81-104] 81 (04/04 0511) Resp:  [16-20] 16 (04/04 0511) BP: (102-110)/(64-70) 110/65 (04/04 0511) SpO2:  [96 %-99 %] 99 % (04/04 0511) Last BM Date: 11/13/16  Intake/Output from previous day: 04/03 0701 - 04/04 0700 In: 1105 [P.O.:840; IV Piggyback:265] Out: -  Intake/Output this shift: Total I/O In: -  Out: 2 [Stool:2]  PE: Gen: Alert, NAD, pleasant Card: Regular rate and rhythm Pulm: Non-labored, clear to auscultation bilaterally Abd: Soft, obese, non-tender, non-distended, bowel sounds present  Ext: No erythema, edema, or tenderness  Lab Results:   Recent Labs  11/12/16 0435  WBC 10.1  HGB 10.3*  HCT 31.5*  PLT 289   BMET No results for input(s): NA, K, CL, CO2, GLUCOSE, BUN, CREATININE, CALCIUM in the last 72 hours. PT/INR  Recent Labs  11/13/16 0440  LABPROT 14.1  INR 1.08   CMP     Component Value Date/Time   NA 137 11/07/2016 0042   K 3.8 11/08/2016 0519   CL 104 11/07/2016 0042   CO2 21 (L) 11/07/2016 0042   GLUCOSE 136 (H) 11/07/2016 0042   BUN 13 11/07/2016 0042   CREATININE 0.85 11/08/2016 0519   CALCIUM 9.3 11/07/2016 0042   PROT 8.0 11/07/2016 0042   ALBUMIN 4.0 11/07/2016 0042   AST 17 11/07/2016 0042   ALT 16 11/07/2016 0042   ALKPHOS 86 11/07/2016 0042   BILITOT 1.1 11/07/2016 0042   GFRNONAA >60 11/08/2016 0519   GFRAA >60 11/08/2016 0519   Lipase     Component Value Date/Time   LIPASE 18 11/07/2016 0042       Studies/Results: Ct Abdomen Pelvis W Contrast  Result Date: 11/12/2016 CLINICAL DATA:  History of perforated appendicitis. Persistent right lower quadrant abdominal pain. EXAM: CT ABDOMEN AND PELVIS WITH CONTRAST  TECHNIQUE: Multidetector CT imaging of the abdomen and pelvis was performed using the standard protocol following bolus administration of intravenous contrast. CONTRAST:  ISOVUE-300 IOPAMIDOL (ISOVUE-300) INJECTION 61% COMPARISON:  CT scan 11/07/2016 FINDINGS: Lower chest: Small right-sided pleural effusion with overlying atelectasis. Hepatobiliary: No focal hepatic lesions or intrahepatic biliary dilatation. The gallbladder is normal. No common bile duct dilatation. Pancreas: No mass, inflammation or ductal dilatation. Spleen: Normal size.  No focal lesions. Adrenals/Urinary Tract: The adrenal glands and kidneys are unremarkable. No renal, ureteral or bladder calculi or mass. Stomach/Bowel: The stomach, duodenum, small bowel colon are grossly normal. There is some inflammation surrounding the cecum along with inflammatory phlegmon. No findings for obstruction or leaking oral contrast. No free air. The Appendix is again demonstrated and findings consistent with prior perforation. Vascular/Lymphatic: The aorta and branch vessels are normal. The major venous structures are patent. Small scattered mesenteric and retroperitoneal lymph nodes are again demonstrated. No mass or overt adenopathy. Reproductive: The uterus and ovaries are grossly normal. Other: Interval development of multiple pelvic fluid collections consistent with abscesses. There is a 3.5 x 3.0 cm rounded abscess in the left pelvis just anterior to the iliopsoas muscle. Complex, possibly connected, abscesses in the right pelvis surrounding the uterus and right ovary and adjacent to the cecum. The largest component measures 3.5 cm. Significant surrounding inflammatory phlegmon. Musculoskeletal: No significant bony findings. IMPRESSION: Findings consistent with perforated  appendicitis with interval development of multiple pelvic abscesses and extensive inflammatory changes. Inflammation of the cecum is also noted. No bowel obstruction or leaking oral  contrast. Electronically Signed   By: Rudie Meyer M.D.   On: 11/12/2016 12:21    Anti-infectives: Anti-infectives    Start     Dose/Rate Route Frequency Ordered Stop   11/12/16 1500  piperacillin-tazobactam (ZOSYN) IVPB 3.375 g     3.375 g 12.5 mL/hr over 240 Minutes Intravenous Every 8 hours 11/12/16 1454     11/10/16 1000  amoxicillin-clavulanate (AUGMENTIN) 875-125 MG per tablet 1 tablet  Status:  Discontinued     1 tablet Oral Every 12 hours 11/10/16 0819 11/12/16 1454   11/07/16 1200  piperacillin-tazobactam (ZOSYN) IVPB 3.375 g  Status:  Discontinued     3.375 g 12.5 mL/hr over 240 Minutes Intravenous Every 8 hours 11/07/16 0714 11/10/16 0819   11/07/16 0500  cefTRIAXone (ROCEPHIN) 2 g in dextrose 5 % 50 mL IVPB  Status:  Discontinued     2 g 100 mL/hr over 30 Minutes Intravenous  Once 11/07/16 0451 11/07/16 0458   11/07/16 0500  metroNIDAZOLE (FLAGYL) IVPB 500 mg  Status:  Discontinued     500 mg 100 mL/hr over 60 Minutes Intravenous  Once 11/07/16 0451 11/07/16 0458   11/07/16 0500  piperacillin-tazobactam (ZOSYN) IVPB 3.375 g     3.375 g 12.5 mL/hr over 240 Minutes Intravenous  Once 11/07/16 0459 11/07/16 0535       Assessment/Plan Acute appendicitis with perforation and peritoneal abscess - repeat CT scan 4/3 significant for development of multiple pelvic abscess >> IR for aspiration v drainge today 4/4 - Leukocytosis resolved and clinically improving with medical management - Continue IV abx  - PRN Miralax   FEN: NPO for procedure, SOFT diet after procedure  ID: Zosyn 3/30-4/1, Augmentin 4/1 - 4/3, Zosyn 4/3 >> VTE: SCD's, resume lovenox tomorrow   Plan: IR drainage of abscesses    LOS: 6 days    Adam Phenix , [facility] 11/13/2016, 10:25 AM Pager: [phone] Consults: [phone] Mon-Fri 7:00 am-4:30 pm Sat-Sun 7:00 am-11:30 am

## 2016-11-13 NOTE — Procedures (Signed)
Interventional Radiology Procedure Note  Procedure: CT guided aspiration of LLQ fluid collection.  ~10cc of thin yellow fluid aspirated. .  Complications: None Recommendations:  - follow up culture - Routine wound care - Do not submerge for 7 days    Signed,  Yvone Neu. Loreta Ave, DO

## 2016-11-14 LAB — CBC
HCT: 33.9 % — ABNORMAL LOW (ref 36.0–46.0)
HEMOGLOBIN: 11.2 g/dL — AB (ref 12.0–15.0)
MCH: 29.1 pg (ref 26.0–34.0)
MCHC: 33 g/dL (ref 30.0–36.0)
MCV: 88.1 fL (ref 78.0–100.0)
Platelets: 337 10*3/uL (ref 150–400)
RBC: 3.85 MIL/uL — ABNORMAL LOW (ref 3.87–5.11)
RDW: 13.3 % (ref 11.5–15.5)
WBC: 10.2 10*3/uL (ref 4.0–10.5)

## 2016-11-14 LAB — BASIC METABOLIC PANEL
Anion gap: 9 (ref 5–15)
BUN: 12 mg/dL (ref 6–20)
CALCIUM: 9 mg/dL (ref 8.9–10.3)
CHLORIDE: 102 mmol/L (ref 101–111)
CO2: 26 mmol/L (ref 22–32)
Creatinine, Ser: 0.72 mg/dL (ref 0.44–1.00)
Glucose, Bld: 103 mg/dL — ABNORMAL HIGH (ref 65–99)
Potassium: 4 mmol/L (ref 3.5–5.1)
SODIUM: 137 mmol/L (ref 135–145)

## 2016-11-14 MED ORDER — SACCHAROMYCES BOULARDII 250 MG PO CAPS
250.0000 mg | ORAL_CAPSULE | Freq: Two times a day (BID) | ORAL | Status: DC
  Administered 2016-11-14: 250 mg via ORAL
  Filled 2016-11-14: qty 1

## 2016-11-14 MED ORDER — ACETAMINOPHEN 325 MG PO TABS: 650.0000 mg | ORAL_TABLET | Freq: Four times a day (QID) | ORAL | Status: DC | PRN

## 2016-11-14 MED ORDER — OXYCODONE HCL 5 MG PO TABS: 5.0000 mg | ORAL_TABLET | ORAL | Status: DC | PRN

## 2016-11-14 MED ORDER — IBUPROFEN 200 MG PO TABS: 600.0000 mg | ORAL_TABLET | Freq: Four times a day (QID) | ORAL | Status: DC | PRN

## 2016-11-14 MED ORDER — SACCHAROMYCES BOULARDII 250 MG PO CAPS: ORAL_CAPSULE | ORAL | Status: DC

## 2016-11-14 MED ORDER — ACETAMINOPHEN 325 MG PO TABS: 650.0000 mg | ORAL_TABLET | ORAL | Status: DC | PRN

## 2016-11-14 MED ORDER — POLYETHYLENE GLYCOL 3350 17 G PO PACK: PACK | ORAL | 0 refills | Status: DC

## 2016-11-14 MED ORDER — AMOXICILLIN-POT CLAVULANATE 875-125 MG PO TABS: 1.0000 | ORAL_TABLET | Freq: Two times a day (BID) | ORAL | 0 refills | Status: DC

## 2016-11-14 MED ORDER — AMOXICILLIN-POT CLAVULANATE 875-125 MG PO TABS
1.0000 | ORAL_TABLET | Freq: Two times a day (BID) | ORAL | Status: DC
  Administered 2016-11-14: 1 via ORAL
  Filled 2016-11-14: qty 1

## 2016-11-14 MED ORDER — ONDANSETRON 4 MG PO TBDP: 4.0000 mg | ORAL_TABLET | Freq: Four times a day (QID) | ORAL | 0 refills | Status: DC | PRN

## 2016-11-14 MED ORDER — OXYCODONE HCL 5 MG PO TABS: 5.0000 mg | ORAL_TABLET | ORAL | 0 refills | Status: DC | PRN

## 2016-11-14 NOTE — Care Management Note (Signed)
Case Management Note  Patient Details  Name: Diane Mclaughlin MRN: [id number] Date of Birth: 1996/02/04  Subjective/Objective:                    Action/Plan:d/c home.   Expected Discharge Date:  11/14/16               Expected Discharge Plan:  Home/Self Care  In-House Referral:     Discharge planning Services  CM Consult  Post Acute Care Choice:    Choice offered to:     DME Arranged:    DME Agency:     HH Arranged:    HH Agency:     Status of Service:  Completed, signed off  If discussed at [employer] of Stay Meetings, dates discussed:    Additional Comments:  Lanier Clam, RN 11/14/2016, 11:39 AM

## 2016-11-14 NOTE — Discharge Summary (Signed)
Physician Discharge Summary  Patient ID: Diane Mclaughlin MRN: [id number] DOB/AGE: 21/21/1997 21 y.o.  Admit date: 11/07/2016 Discharge date: 11/14/2016  Admission Diagnoses:  Perforated appendicitis with peritoneal abscess. History of chronic constipation BMI 37  Discharge Diagnoses:  Same   Principal Problem:   Acute appendicitis with perforation and peritoneal abscess Active Problems:   Morbid obesity with body mass index of 40.0-49.9 (HCC)   Constipation, chronic   Seasonal allergies   PROCEDURES: CT-guided aspiration of intra-abdominal fluid collection 11/13/16  Hospital Course: Patient presented to the ED on 11/07/16, with a generalized lower abdominal pain, trouble voiding for 2 days. Patient has a history of constipation and prior obstructions but never required NG placement. Workup in the emergency department shows white count of 16,800., Elevated glucose of 136. The remainder of the labs were normal. CT scan showed acute appendicitis with extensive surrounding inflammatory changes and a 22 mm abscess associated with the appendiceal tip indicating microperforation. There is no bowel obstruction.  She was seen in the ED and admitted by Dr. Viviann Spare gross. It was his opinion she had acute appendicitis with probable perforation and small abscess. This recommendation we treated her medically at this point. She was started on IV Zosyn. Spiked fever up to 102 on 11/08/16. CT scan was repeated on 11/12/16. His again demonstrated findings consistent with a perforated appendix. There is interval development of a pelvic fluid collections consistent with an abscess. There is a 3.5 x 3 cm rounded abscess in the left pelvis just anterior to the iliopsoas muscle. There is some inflammation surrounding the cecum along with inflammatory phlegmon. No obstruction or leaking of oral contrast and no free air. IR attempted aspiration on 11/13/16, and received about 10 mL of thin yellow fluid. We converted her back to  Augmentin on 11/14/16. Repeat labs shows a normal WBC. BMP was normal, glucose was mildly elevated at 103. On exam she still has some soreness but does not have peritonitis. Overall she is making good progress and it was Dr. Ermalene Searing opinion she can be discharged home.   She will go home on 7 more days of Augmentin. Probiotic daily. She is instructed to return if she has troubles with increasing nausea, vomiting, fever, trouble voiding. I recommended she call the office and then plan to follow-up back here at [facility]. I recommended she contact her primary care in [place] and let them know what's going on so they can follow her up in the future.  Her antibiotics were adjusted a few times. Summary below. ID:  Ceftriaxone 1 dose, 3/28;  Zosyn 3/28-3/31 >> Augmentin 4/1-4/3 >>Zosyn 4/3-4/5 11/14/16=>> Augmentin   CBC Latest Ref Rng & Units 11/14/2016 11/12/2016 11/10/2016  WBC 4.0 - 10.5 K/uL 10.2 10.1 9.8  Hemoglobin 12.0 - 15.0 g/dL 11.2(L) 10.3(L) 10.4(L)  Hematocrit 36.0 - 46.0 % 33.9(L) 31.5(L) 31.5(L)  Platelets 150 - 400 K/uL 337 289 260    CMP Latest Ref Rng & Units 11/14/2016 11/08/2016 11/07/2016  Glucose 65 - 99 mg/dL 409(W) - 119(J)  BUN 6 - 20 mg/dL 12 - 13  Creatinine 4.78 - 1.00 mg/dL 2.95 6.21 3.08  Sodium 135 - 145 mmol/L 137 - 137  Potassium 3.5 - 5.1 mmol/L 4.0 3.8 4.0  Chloride 101 - 111 mmol/L 102 - 104  CO2 22 - 32 mmol/L 26 - 21(L)  Calcium 8.9 - 10.3 mg/dL 9.0 - 9.3  Total Protein 6.5 - 8.1 g/dL - - 8.0  Total Bilirubin 0.3 - 1.2 mg/dL - -  1.1  Alkaline Phos 38 - 126 U/L - - 86  AST 15 - 41 U/L - - 17  ALT 14 - 54 U/L - - 16    Condition ON discharge: Improved.    Disposition: Home   Allergies as of 11/14/2016      Reactions   Lactose Intolerance (gi) Nausea Only      Medication List    TAKE these medications   acetaminophen 325 MG tablet Commonly known as:  TYLENOL Take 2 tablets (650 mg total) by mouth every 4 (four) hours as needed for mild pain,  moderate pain or headache.   amoxicillin-clavulanate 875-125 MG tablet Commonly known as:  AUGMENTIN Take 1 tablet by mouth every 12 (twelve) hours.   fluticasone 50 MCG/ACT nasal spray Commonly known as:  FLONASE Place 1 spray into both nostrils daily.   glycerin adult 2 g suppository Place 1 suppository rectally as needed for constipation.   magnesium hydroxide 400 MG/5ML suspension Commonly known as:  MILK OF MAGNESIA Take 30 mLs by mouth daily as needed for mild constipation.   naproxen sodium 220 MG tablet Commonly known as:  ANAPROX Take 220 mg by mouth 2 (two) times daily with a meal.   ondansetron 4 MG disintegrating tablet Commonly known as:  ZOFRAN-ODT Take 1 tablet (4 mg total) by mouth every 6 (six) hours as needed for nausea.   oxyCODONE 5 MG immediate release tablet Commonly known as:  Oxy IR/ROXICODONE Take 1 tablet (5 mg total) by mouth every 4 (four) hours as needed for severe pain (Pain not relieved by Tylenol or Ibuprofen).   polyethylene glycol packet Commonly known as:  MIRALAX / GLYCOLAX You can use this daily for your constipation.  Follow package instructions.  You want 1 soft BM per day.  You can adjust this as needed.  You can buy this at any drug store over the counter.   saccharomyces boulardii 250 MG capsule Commonly known as:  FLORASTOR You can buy this at any drug store.  Follow package instructions.  Use for the next month.      Follow-up Information    Ardeth Sportsman., MD Follow up on 11/21/2016.   Specialty:  General Surgery Why:  Your appointment is at 10:45 AM, be at the office 30 minutes early for check in.   Go for labs on 11/20/16, so he will have results when you return to the office.   Call your family practice office and let them know what is happening. Contact information: [address] [place] [address] [phone]           Signed: Sherrie George 11/14/2016, 11:50 AM

## 2016-11-14 NOTE — Progress Notes (Signed)
MEDICATION-RELATED CONSULT NOTE   IR Procedure Consult - Anticoagulant/Antiplatelet PTA/Inpatient Med List Review by Pharmacist    Procedure: CT guided aspiration of LLQ fluid collection    Completed: 11/13/16  Post-Procedural bleeding risk per IR MD assessment:  Low  Antithrombotic medications on inpatient or PTA profile prior to procedure:   Lovenox  daily    Recommended restart time per IR Post-Procedure Guidelines:  4 hr post-procedure   Other considerations:      Plan:  Resume Lovenox  daily at 2200 (4/4)  Otho Bellows PharmD Pager [phone] 11/14/2016, 7:35 AM

## 2016-11-14 NOTE — Discharge Instructions (Signed)
Appendicitis The appendix is a finger-shaped tube that is attached to the large intestine. Appendicitis is inflammation of the appendix. Without treatment, appendicitis can cause the appendix to tear (rupture). A ruptured appendix can lead to a life-threatening infection. It can also lead to the formation of a painful collection of pus (abscess) in the appendix. What are the causes? This condition may be caused by a blockage in the appendix that leads to infection. The blockage can be due to:  A ball of stool.  Enlarged lymph glands. In some cases, the cause may not be known. What increases the risk? This condition is more likely to develop in people who are 91-83 years of age. What are the signs or symptoms? Symptoms of this condition include:  Pain around the belly button that moves toward the lower right abdomen. The pain can become more severe as time passes. It gets worse with coughing or sudden movements.  Tenderness in the lower right abdomen.  Nausea.  Vomiting.  Loss of appetite.  Fever.  Constipation.  Diarrhea.  Generally not feeling well. How is this diagnosed? This condition may be diagnosed with:  A physical exam.  Blood tests.  Urine test. To confirm the diagnosis, an ultrasound, MRI, or CT scan may be done. How is this treated? This condition is usually treated by taking out the appendix (appendectomy). There are two methods for doing an appendectomy:  Open appendectomy. In this surgery, the appendix is removed through a large cut (incision) that is made in the lower right abdomen. This procedure may be recommended if:  You have major scarring from a previous surgery.  You have a bleeding disorder.  You are pregnant and are near term.  You have a condition that makes the laparoscopic procedure impossible, such as an advanced infection or a ruptured appendix.  Laparoscopic appendectomy. In this surgery, the appendix is removed through small  incisions. This procedure usually causes less pain and fewer problems than an open appendectomy. It also has a shorter recovery time. If the appendix has ruptured and an abscess has formed, a drain may be placed into the abscess to remove fluid and antibiotic medicines may be given through an IV tube. The appendix may or may not need to be removed. This information is not intended to replace advice given to you by your health care provider. Make sure you discuss any questions you have with your health care provider. Document Released: 07/29/2005 Document Revised: 12/06/2015 Document Reviewed: 12/14/2014

## 2016-11-14 NOTE — Progress Notes (Signed)
Subjective: CC:  Perforated appendicitis and peritoneal abscess She still seems very tender on exam. She is using Robaxin for pain control. Tolerating soft diet. She reports no BM yesterday, and attributes it to not getting MiraLAX.  Objective: Vital signs in last 24 hours: Temp:  [98.3 F (36.8 C)-98.9 F (37.2 C)] 98.3 F (36.8 C) (04/05 0522) Pulse Rate:  [74-100] 88 (04/05 0522) Resp:  [14-25] 16 (04/05 0522) BP: (102-129)/(51-72) 102/55 (04/05 0522) SpO2:  [98 %-100 %] 100 % (04/05 0522) Last BM Date: 11/13/16  PO 480 Urine x 5 Stool x 4 Afebrile, VSS No labs CT scan 11/12/16:  The Appendix is again demonstrated and findings consistent with prior perforation. Interval development of multiple pelvic fluid collections consistent with abscesses. There is a 3.5 x 3.0 cm rounded abscess in the left pelvis just anterior to the iliopsoas muscle. Complex, possibly connected, abscesses in the right pelvis surrounding the uterus and right ovary and adjacent to the cecum. The largest component measures 3.5 cm. Significant surrounding inflammatory phlegmon. There is some inflammation surrounding the cecum along with inflammatory phlegmon. No findings for obstruction or leaking oral contrast. No free air.  IR drain >> aspiration LLQ with 10 cc of yellow fluid aspirtated  Intake/Output from previous day: 04/04 0701 - 04/05 0700 In: 643.4 [P.O.:480; I.V.:8.4; IV Piggyback:155] Out: 4 [Stool:4] Intake/Output this shift: No intake/output data recorded.  General appearance: alert, cooperative and no distress Resp: clear to auscultation bilaterally GI: soft tender, especially lower abdomen.  No peritonitis.  Large abdomen.  Lab Results:   Recent Labs  11/12/16 0435  WBC 10.1  HGB 10.3*  HCT 31.5*  PLT 289    BMET No results for input(s): NA, K, CL, CO2, GLUCOSE, BUN, CREATININE, CALCIUM in the last 72 hours. PT/INR  Recent Labs  11/13/16 0440  LABPROT 14.1  INR  1.08    No results for input(s): AST, ALT, ALKPHOS, BILITOT, PROT, ALBUMIN in the last 168 hours.   Lipase     Component Value Date/Time   LIPASE 18 11/07/2016 0042     Studies/Results: Ct Abdomen Pelvis W Contrast  Result Date: 11/12/2016 CLINICAL DATA:  History of perforated appendicitis. Persistent right lower quadrant abdominal pain. EXAM: CT ABDOMEN AND PELVIS WITH CONTRAST TECHNIQUE: Multidetector CT imaging of the abdomen and pelvis was performed using the standard protocol following bolus administration of intravenous contrast. CONTRAST:  ISOVUE-300 IOPAMIDOL (ISOVUE-300) INJECTION 61% COMPARISON:  CT scan 11/07/2016 FINDINGS: Lower chest: Small right-sided pleural effusion with overlying atelectasis. Hepatobiliary: No focal hepatic lesions or intrahepatic biliary dilatation. The gallbladder is normal. No common bile duct dilatation. Pancreas: No mass, inflammation or ductal dilatation. Spleen: Normal size.  No focal lesions. Adrenals/Urinary Tract: The adrenal glands and kidneys are unremarkable. No renal, ureteral or bladder calculi or mass. Stomach/Bowel: The stomach, duodenum, small bowel colon are grossly normal. There is some inflammation surrounding the cecum along with inflammatory phlegmon. No findings for obstruction or leaking oral contrast. No free air. The Appendix is again demonstrated and findings consistent with prior perforation. Vascular/Lymphatic: The aorta and branch vessels are normal. The major venous structures are patent. Small scattered mesenteric and retroperitoneal lymph nodes are again demonstrated. No mass or overt adenopathy. Reproductive: The uterus and ovaries are grossly normal. Other: Interval development of multiple pelvic fluid collections consistent with abscesses. There is a 3.5 x 3.0 cm rounded abscess in the left pelvis just anterior to the iliopsoas muscle. Complex, possibly connected, abscesses in the right pelvis surrounding  the uterus and right  ovary and adjacent to the cecum. The largest component measures 3.5 cm. Significant surrounding inflammatory phlegmon. Musculoskeletal: No significant bony findings. IMPRESSION: Findings consistent with perforated appendicitis with interval development of multiple pelvic abscesses and extensive inflammatory changes. Inflammation of the cecum is also noted. No bowel obstruction or leaking oral contrast. Electronically Signed   By: Rudie Meyer M.D.   On: 11/12/2016 12:21   Prior to Admission medications   Medication Sig Start Date End Date Taking? Authorizing Provider  fluticasone (FLONASE) 50 MCG/ACT nasal spray Place 1 spray into both nostrils daily.   Yes Historical Provider, MD  glycerin adult 2 g suppository Place 1 suppository rectally as needed for constipation.   Yes Historical Provider, MD  magnesium hydroxide (MILK OF MAGNESIA) 400 MG/5ML suspension Take 30 mLs by mouth daily as needed for mild constipation.   Yes Historical Provider, MD  naproxen sodium (ANAPROX) 220 MG tablet Take 220 mg by mouth 2 (two) times daily with a meal.   Yes Historical Provider, MD     Medications: . chlorhexidine  15 mL Mouth Rinse BID  . enoxaparin (LOVENOX) injection  40 mg Subcutaneous Q24H  . fluticasone  1 spray Each Nare Daily  . lip balm  1 application Topical BID  . mouth rinse  15 mL Mouth Rinse q12n4p  . methocarbamol (ROBAXIN)  IV  500 mg Intravenous Q8H  . piperacillin-tazobactam (ZOSYN)  IV  3.375 g Intravenous Q8H  . polyethylene glycol  17 g Oral Daily   No IV fluids  Assessment/Plan Perforated appendicitis and peritoneal abscess CT guided aspiration 11/13/16 Hx of chronic constipation. Body mass index is 37 FEN:  Soft diet ID:  Ceftriaxone 1 dose, 3/28;  Zosyn 3/28-3/31 >> Augmentin 4/1-4/3 >>Zosyn 4/3-4/5 8 days of antibiotics so far DVT:  Lovenox  Plan: Recheck labs this a.m. switch to Augmentin and get her up some.  Aspiration site looks fine.  She is going home with her mom.   I have a follow up appointment with Dr. Michaell Cowing next week.  I will have her get labs before she goes back to the office also I will send her home with 7 more days of Augmentin.  I have personally reviewed the patients medication history on the [place] controlled substance database.      LOS: 7 days    Avory Mimbs 11/14/2016 [phone]

## 2016-11-17 LAB — BODY FLUID CULTURE: CULTURE: NO GROWTH

## 2016-11-18 LAB — ANAEROBIC CULTURE

## 2016-11-21 ENCOUNTER — Ambulatory Visit: Payer: Self-pay | Admitting: Surgery

## 2016-12-19 NOTE — Patient Instructions (Addendum)
Diane Mclaughlin  12/19/2016   Your procedure is scheduled on: 12-27-16  Report to [facility] Main  Entrance Take [place]  elevators to 3rd floor to  Short Stay Center at 530AM.   Call this number if you have problems the morning of surgery [phone]    Remember: ONLY 1 PERSON MAY GO WITH YOU TO SHORT STAY TO GET  READY MORNING OF YOUR SURGERY.  Do not eat food or drink liquids :After Midnight.     Take these medicines the morning of surgery with A SIP OF WATER: NONE                                You may not have any metal on your body including hair pins and              piercings  Do not wear jewelry, make-up, lotions, powders or perfumes, deodorant             Do not wear nail polish.  Do not shave  48 hours prior to surgery.     Do not bring valuables to the hospital. [facility] IS NOT             RESPONSIBLE   FOR VALUABLES.  Contacts, dentures or bridgework may not be worn into surgery.     Patients discharged the day of surgery will not be allowed to drive home.  Name and phone number of your driver:  Special Instructions: N/A              Please read over the following fact sheets you were given: _____________________________________________________________________             [facility] - Preparing for Surgery Before surgery, you can play an important role.  Because skin is not sterile, your skin needs to be as free of germs as possible.  You can reduce the number of germs on your skin by washing with CHG (chlorahexidine gluconate) soap before surgery.  CHG is an antiseptic cleaner which kills germs and bonds with the skin to continue killing germs even after washing. Please DO NOT use if you have an allergy to CHG or antibacterial soaps.  If your skin becomes reddened/irritated stop using the CHG and inform your nurse when you arrive at Short Stay. Do not shave (including legs and underarms) for at least 48 hours prior to the first CHG shower.   You may shave your face/neck. Please follow these instructions carefully:  1.  Shower with CHG Soap the night before surgery and the  morning of Surgery.  2.  If you choose to wash your hair, wash your hair first as usual with your  normal  shampoo.  3.  After you shampoo, rinse your hair and body thoroughly to remove the  shampoo.                           4.  Use CHG as you would any other liquid soap.  You can apply chg directly  to the skin and wash                       Gently with a scrungie or clean washcloth.  5.  Apply the CHG Soap to your body ONLY FROM THE NECK  DOWN.   Do not use on face/ open                           Wound or open sores. Avoid contact with eyes, ears mouth and genitals (private parts).                       Wash face,  Genitals (private parts) with your normal soap.             6.  Wash thoroughly, paying special attention to the area where your surgery  will be performed.  7.  Thoroughly rinse your body with warm water from the neck down.  8.  DO NOT shower/wash with your normal soap after using and rinsing off  the CHG Soap.                9.  Pat yourself dry with a clean towel.            10.  Wear clean pajamas.            11.  Place clean sheets on your bed the night of your first shower and do not  sleep with pets. Day of Surgery : Do not apply any lotions/deodorants the morning of surgery.  Please wear clean clothes to the hospital/surgery center.  FAILURE TO FOLLOW THESE INSTRUCTIONS MAY RESULT IN THE CANCELLATION OF YOUR SURGERY PATIENT SIGNATURE_________________________________  NURSE SIGNATURE__________________________________  ________________________________________________________________________   Pureed  The foods have a pudding-like texture, such as the texture of pureed pancakes, mashed potatoes, and yogurt. The diet does not include foods with lumps or coarse textures. Liquids should be smooth and may either be thin, nectar-thick,  honey-like, or spoon-thick.  What do I need to know about this diet? Foods   You may eat foods that are soft and have a pudding-like texture. If a food does not have this texture, you may be able to eat the food after:  Pureeing it. This can be done with a blender or whisk.  Moistening it with liquid. For example, you may have bread if you soak it in milk or syrup.  Avoid foods that are hard, dry, sticky, chunky, lumpy, or stringy. Also avoid foods with nuts, seeds, raisins, skins, and pulp.  Do not eat foods that you have to chew. If you have to chew the food, then you cannot eat it.  Eat a variety of foods to get all the nutrients you need. Liquids   You may drink liquids that are smooth. Your health care provider will tell you if you should drink thin or thickened liquids.  Thin liquids include fruit juices, milk, coffee, tea, yogurts, shakes, and similar foods that melt to thin liquid at room temperature.  Avoid liquids with seeds, pulp, or chunks. See your dietitian or health care provider regularly for help with your dietary changes.  What foods can I eat?  Vegetables  Pureed vegetables. Soft avocado. Smooth tomato paste or sauce. Strained or pureed soups (these may need to be thickened as directed). Mashed or pureed potatoes without skin (can be seasoned with butter, smooth gravy, margarine, or sour cream). Fruits  Pureed fruits such as melons and apples without seeds or pulp. Mashed bananas. Smooth tomato paste or sauce. Fruit juices without pulp or seeds. Strained or pureed soups. Meat and Other Protein Sources  Pureed meat. Smooth pate or liverwurst. Smooth souffles. Pureed beans (such as lentils). Pureed  eggs. Dairy  Yogurt. Smooth cheese sauces. Milk (may need to be thickened). Nutritional dairy drinks or shakes. Ask your health care provider whether you can have ice cream. Condiments  Finely ground salt, pepper, and other ground spices. Sweets/Desserts  Smooth  puddings and custards. Pureed desserts. Souffles. Whipped topping. Ask your health care provider whether you can have frozen desserts. Fats and Oils  Butter. Margarine. Smooth and strained gravy. Sour cream. Mayonnaise. Cream cheese. Whipped topping. Smooth sauces (such as white sauce, cheese sauce, or hollandaise sauce). The items listed above may not be a complete list of recommended foods or beverages. Contact your dietitian for more options.    What foods are not recommended? Grains  Oatmeal. Dry cereals. Hard breads. Vegetables  Whole vegetables. Stringy vegetables (such as celery). Thin tomato sauce. Fruits  Whole fresh, frozen, canned, or dried fruits that have not been pureed. Stringy fruits (such as pineapple). Meat and Other Protein Sources  Whole or ground meat, fish, or poultry. Dried or cooked lentils or legumes that have been cooked but not mashed or pureed. Non-pureed eggs. Nuts and seeds. Peanut butter. Dairy  Non-pureed cheese. Dairy products with lumps or chunks. Ask your health care provider whether you can have ice cream. Condiments  Coarse or seeded herbs and spices. Sweets/Desserts  [place] preserves. Jams with seeds. Solid desserts. Sticky, chewy sweets (such as licorice and caramel). Ask your health care provider whether you can have frozen desserts. Fats and Oils  Sauces of fats with lumps or chunks.  The items listed above may not be a complete list of foods and beverages to avoid. Contact your dietitian for more information.  This information is not intended to replace advice given to you by your health care provider. Make sure you discuss any questions you have with your health care provider. Document Released: 07/29/2005 Document Revised: 01/04/2016 Document Reviewed: 07/12/2013 Elsevier Interactive Patient Education  2017 [employer].

## 2016-12-19 NOTE — Progress Notes (Signed)
CXR 11-07-16 EPIC

## 2016-12-20 ENCOUNTER — Encounter (HOSPITAL_COMMUNITY): Payer: Self-pay

## 2016-12-20 ENCOUNTER — Encounter (INDEPENDENT_AMBULATORY_CARE_PROVIDER_SITE_OTHER): Payer: Self-pay

## 2016-12-20 ENCOUNTER — Encounter (HOSPITAL_COMMUNITY)
Admission: RE | Admit: 2016-12-20 | Discharge: 2016-12-20 | Disposition: A | Discharge status: Home / Self Care | Payer: PRIVATE HEALTH INSURANCE | Source: Ambulatory Visit | Attending: Surgery | Admitting: Surgery

## 2016-12-20 DIAGNOSIS — Z01812 Encounter for preprocedural laboratory examination: Secondary | ICD-10-CM | POA: Insufficient documentation

## 2016-12-20 DIAGNOSIS — K352 Acute appendicitis with generalized peritonitis: Secondary | ICD-10-CM | POA: Diagnosis not present

## 2016-12-20 HISTORY — DX: Cutaneous abscess, unspecified: L02.91

## 2016-12-20 HISTORY — DX: Pneumonia, unspecified organism: J18.9

## 2016-12-20 LAB — CBC
HEMATOCRIT: 36.5 % (ref 36.0–46.0)
HEMOGLOBIN: 12.2 g/dL (ref 12.0–15.0)
MCH: 29.1 pg (ref 26.0–34.0)
MCHC: 33.4 g/dL (ref 30.0–36.0)
MCV: 87.1 fL (ref 78.0–100.0)
Platelets: 218 10*3/uL (ref 150–400)
RBC: 4.19 MIL/uL (ref 3.87–5.11)
RDW: 14.4 % (ref 11.5–15.5)
WBC: 6.8 10*3/uL (ref 4.0–10.5)

## 2016-12-20 LAB — BASIC METABOLIC PANEL
ANION GAP: 10 (ref 5–15)
BUN: 12 mg/dL (ref 6–20)
CHLORIDE: 105 mmol/L (ref 101–111)
CO2: 26 mmol/L (ref 22–32)
CREATININE: 0.77 mg/dL (ref 0.44–1.00)
Calcium: 9.5 mg/dL (ref 8.9–10.3)
GFR calc non Af Amer: >60 mL/min (ref >60)
GLUCOSE: 94 mg/dL (ref 65–99)
Potassium: 3.9 mmol/L (ref 3.5–5.1)
Sodium: 141 mmol/L (ref 135–145)

## 2016-12-20 LAB — HCG, SERUM, QUALITATIVE: Preg, Serum: NEGATIVE

## 2016-12-27 ENCOUNTER — Ambulatory Visit (HOSPITAL_COMMUNITY): Payer: PRIVATE HEALTH INSURANCE | Admitting: Registered Nurse

## 2016-12-27 ENCOUNTER — Encounter (HOSPITAL_COMMUNITY): Payer: Self-pay | Admitting: *Deleted

## 2016-12-27 ENCOUNTER — Encounter (HOSPITAL_COMMUNITY)
Admission: RE | Disposition: A | Discharge status: Home / Self Care | Payer: Self-pay | Source: Ambulatory Visit | Attending: Surgery

## 2016-12-27 ENCOUNTER — Ambulatory Visit (HOSPITAL_COMMUNITY)
Admission: RE | Admit: 2016-12-27 | Discharge: 2016-12-27 | Disposition: A | Discharge status: Home / Self Care | Payer: PRIVATE HEALTH INSURANCE | Source: Ambulatory Visit | Attending: Surgery | Admitting: Surgery

## 2016-12-27 DIAGNOSIS — E669 Obesity, unspecified: Secondary | ICD-10-CM | POA: Insufficient documentation

## 2016-12-27 DIAGNOSIS — Z6836 Body mass index (BMI) 36.0-36.9, adult: Secondary | ICD-10-CM | POA: Diagnosis not present

## 2016-12-27 DIAGNOSIS — K353 Acute appendicitis with localized peritonitis: Secondary | ICD-10-CM | POA: Diagnosis not present

## 2016-12-27 DIAGNOSIS — K36 Other appendicitis: Secondary | ICD-10-CM | POA: Diagnosis not present

## 2016-12-27 DIAGNOSIS — K3580 Unspecified acute appendicitis: Secondary | ICD-10-CM | POA: Diagnosis present

## 2016-12-27 HISTORY — PX: LAPAROSCOPIC APPENDECTOMY: SHX408

## 2016-12-27 SURGERY — APPENDECTOMY, LAPAROSCOPIC
Anesthesia: General | Site: Abdomen

## 2016-12-27 MED ORDER — ONDANSETRON HCL 4 MG/2ML IJ SOLN
INTRAMUSCULAR | Status: DC | PRN
  Administered 2016-12-27: 4 mg via INTRAVENOUS

## 2016-12-27 MED ORDER — CHLORHEXIDINE GLUCONATE CLOTH 2 % EX PADS: 6.0000 | MEDICATED_PAD | Freq: Once | CUTANEOUS | Status: DC

## 2016-12-27 MED ORDER — PROPOFOL 10 MG/ML IV BOLUS
INTRAVENOUS | Status: AC
  Filled 2016-12-27: qty 20

## 2016-12-27 MED ORDER — MEPERIDINE HCL 50 MG/ML IJ SOLN
INTRAMUSCULAR | Status: AC
  Filled 2016-12-27: qty 1

## 2016-12-27 MED ORDER — SUCCINYLCHOLINE CHLORIDE 200 MG/10ML IV SOSY
PREFILLED_SYRINGE | INTRAVENOUS | Status: AC
  Filled 2016-12-27: qty 10

## 2016-12-27 MED ORDER — LIDOCAINE 2% (20 MG/ML) 5 ML SYRINGE
INTRAMUSCULAR | Status: AC
  Filled 2016-12-27: qty 5

## 2016-12-27 MED ORDER — OXYCODONE HCL 5 MG/5ML PO SOLN: 5.0000 mg | Freq: Once | ORAL | Status: DC | PRN

## 2016-12-27 MED ORDER — 0.9 % SODIUM CHLORIDE (POUR BTL) OPTIME
TOPICAL | Status: DC | PRN
  Administered 2016-12-27: 1000 mL

## 2016-12-27 MED ORDER — TRAMADOL HCL 50 MG PO TABS: 50.0000 mg | ORAL_TABLET | Freq: Four times a day (QID) | ORAL | 0 refills | Status: DC | PRN

## 2016-12-27 MED ORDER — SUGAMMADEX SODIUM 500 MG/5ML IV SOLN
INTRAVENOUS | Status: AC
  Filled 2016-12-27: qty 5

## 2016-12-27 MED ORDER — CEFOTETAN DISODIUM-DEXTROSE 2-2.08 GM-% IV SOLR
INTRAVENOUS | Status: AC
  Filled 2016-12-27: qty 50

## 2016-12-27 MED ORDER — ACETAMINOPHEN 500 MG PO TABS
1000.0000 mg | ORAL_TABLET | ORAL | Status: AC
  Administered 2016-12-27: 1000 mg via ORAL
  Filled 2016-12-27: qty 2

## 2016-12-27 MED ORDER — CELECOXIB 200 MG PO CAPS
400.0000 mg | ORAL_CAPSULE | ORAL | Status: AC
  Administered 2016-12-27: 400 mg via ORAL
  Filled 2016-12-27: qty 2

## 2016-12-27 MED ORDER — ONDANSETRON HCL 4 MG/2ML IJ SOLN: 4.0000 mg | Freq: Four times a day (QID) | INTRAMUSCULAR | Status: DC | PRN

## 2016-12-27 MED ORDER — LACTATED RINGERS IR SOLN
Status: DC | PRN
  Administered 2016-12-27: 1000 mL

## 2016-12-27 MED ORDER — SUGAMMADEX SODIUM 500 MG/5ML IV SOLN
INTRAVENOUS | Status: DC | PRN
  Administered 2016-12-27: 210 mg via INTRAVENOUS

## 2016-12-27 MED ORDER — NAPROXEN 500 MG PO TABS: 500.0000 mg | ORAL_TABLET | Freq: Two times a day (BID) | ORAL | 1 refills | Status: DC | PRN

## 2016-12-27 MED ORDER — PROPOFOL 10 MG/ML IV BOLUS
INTRAVENOUS | Status: DC | PRN
  Administered 2016-12-27: 200 mg via INTRAVENOUS

## 2016-12-27 MED ORDER — LACTATED RINGERS IV SOLN: INTRAVENOUS | Status: DC

## 2016-12-27 MED ORDER — ROCURONIUM BROMIDE 100 MG/10ML IV SOLN
INTRAVENOUS | Status: DC | PRN
  Administered 2016-12-27: 10 mg via INTRAVENOUS
  Administered 2016-12-27: 40 mg via INTRAVENOUS

## 2016-12-27 MED ORDER — ROCURONIUM BROMIDE 50 MG/5ML IV SOSY
PREFILLED_SYRINGE | INTRAVENOUS | Status: AC
  Filled 2016-12-27: qty 10

## 2016-12-27 MED ORDER — MEPERIDINE HCL 50 MG/ML IJ SOLN
6.2500 mg | INTRAMUSCULAR | Status: DC | PRN
Start: 2016-12-27 — End: 2016-12-27
  Administered 2016-12-27: 12.5 mg via INTRAVENOUS

## 2016-12-27 MED ORDER — FENTANYL CITRATE (PF) 250 MCG/5ML IJ SOLN
INTRAMUSCULAR | Status: AC
  Filled 2016-12-27: qty 5

## 2016-12-27 MED ORDER — BUPIVACAINE HCL (PF) 0.25 % IJ SOLN
INTRAMUSCULAR | Status: AC
  Filled 2016-12-27: qty 60

## 2016-12-27 MED ORDER — MIDAZOLAM HCL 2 MG/2ML IJ SOLN
INTRAMUSCULAR | Status: AC
  Filled 2016-12-27: qty 2

## 2016-12-27 MED ORDER — GABAPENTIN 300 MG PO CAPS
300.0000 mg | ORAL_CAPSULE | ORAL | Status: AC
  Administered 2016-12-27: 300 mg via ORAL
  Filled 2016-12-27: qty 1

## 2016-12-27 MED ORDER — CEFOTETAN DISODIUM-DEXTROSE 2-2.08 GM-% IV SOLR
2.0000 g | INTRAVENOUS | Status: AC
  Administered 2016-12-27: 2 g via INTRAVENOUS

## 2016-12-27 MED ORDER — HYDROMORPHONE HCL 1 MG/ML IJ SOLN
0.2500 mg | INTRAMUSCULAR | Status: DC | PRN
  Administered 2016-12-27: 0.5 mg via INTRAVENOUS
  Administered 2016-12-27: 0.25 mg via INTRAVENOUS

## 2016-12-27 MED ORDER — ONDANSETRON HCL 4 MG/2ML IJ SOLN
INTRAMUSCULAR | Status: AC
  Filled 2016-12-27: qty 2

## 2016-12-27 MED ORDER — FENTANYL CITRATE (PF) 100 MCG/2ML IJ SOLN
INTRAMUSCULAR | Status: DC | PRN
  Administered 2016-12-27: 100 ug via INTRAVENOUS
  Administered 2016-12-27 (×2): 25 ug via INTRAVENOUS
  Administered 2016-12-27: 50 ug via INTRAVENOUS

## 2016-12-27 MED ORDER — PHENYLEPHRINE HCL 10 MG/ML IJ SOLN
INTRAMUSCULAR | Status: DC | PRN
  Administered 2016-12-27 (×3): 40 ug via INTRAVENOUS

## 2016-12-27 MED ORDER — PHENYLEPHRINE 40 MCG/ML (10ML) SYRINGE FOR IV PUSH (FOR BLOOD PRESSURE SUPPORT)
PREFILLED_SYRINGE | INTRAVENOUS | Status: AC
  Filled 2016-12-27: qty 10

## 2016-12-27 MED ORDER — DEXAMETHASONE SODIUM PHOSPHATE 10 MG/ML IJ SOLN
INTRAMUSCULAR | Status: DC | PRN
  Administered 2016-12-27: 10 mg via INTRAVENOUS

## 2016-12-27 MED ORDER — OXYCODONE HCL 5 MG PO TABS: 5.0000 mg | ORAL_TABLET | Freq: Once | ORAL | Status: DC | PRN

## 2016-12-27 MED ORDER — MIDAZOLAM HCL 2 MG/2ML IJ SOLN
INTRAMUSCULAR | Status: DC | PRN
  Administered 2016-12-27 (×2): 1 mg via INTRAVENOUS

## 2016-12-27 MED ORDER — BUPIVACAINE HCL (PF) 0.25 % IJ SOLN
INTRAMUSCULAR | Status: DC | PRN
  Administered 2016-12-27: 60 mL

## 2016-12-27 MED ORDER — LIDOCAINE HCL (CARDIAC) 20 MG/ML IV SOLN
INTRAVENOUS | Status: DC | PRN
  Administered 2016-12-27: 60 mg via INTRAVENOUS

## 2016-12-27 MED ORDER — EPHEDRINE 5 MG/ML INJ
INTRAVENOUS | Status: AC
  Filled 2016-12-27: qty 10

## 2016-12-27 MED ORDER — HYDROMORPHONE HCL 1 MG/ML IJ SOLN
INTRAMUSCULAR | Status: AC
  Administered 2016-12-27: 0.25 mg via INTRAVENOUS
  Filled 2016-12-27: qty 1

## 2016-12-27 MED ORDER — LACTATED RINGERS IV SOLN
INTRAVENOUS | Status: DC | PRN
  Administered 2016-12-27: 07:00:00 via INTRAVENOUS

## 2016-12-27 SURGICAL SUPPLY — 42 items
APPLIER CLIP 5 13 M/L LIGAMAX5 (MISCELLANEOUS)
APPLIER CLIP ROT 10 11.4 M/L (STAPLE)
CABLE HIGH FREQUENCY MONO STRZ (ELECTRODE) ×3 IMPLANT
CHLORAPREP W/TINT 26ML (MISCELLANEOUS) ×3 IMPLANT
CLIP APPLIE 5 13 M/L LIGAMAX5 (MISCELLANEOUS) IMPLANT
CLIP APPLIE ROT 10 11.4 M/L (STAPLE) IMPLANT
CLIP LIGATING HEMO LOK XL GOLD (MISCELLANEOUS) ×3 IMPLANT
COVER SURGICAL LIGHT HANDLE (MISCELLANEOUS) ×3 IMPLANT
CUTTER FLEX LINEAR 45M (STAPLE) ×3 IMPLANT
DECANTER SPIKE VIAL GLASS SM (MISCELLANEOUS) ×3 IMPLANT
DEVICE TROCAR PUNCTURE CLOSURE (ENDOMECHANICALS) IMPLANT
DRAPE LAPAROSCOPIC ABDOMINAL (DRAPES) ×3 IMPLANT
DRAPE WARM FLUID 44X44 (DRAPE) ×3 IMPLANT
DRSG TEGADERM 2-3/8X2-3/4 SM (GAUZE/BANDAGES/DRESSINGS) ×3 IMPLANT
DRSG TEGADERM 4X4.75 (GAUZE/BANDAGES/DRESSINGS) ×3 IMPLANT
ELECT REM PT RETURN 15FT ADLT (MISCELLANEOUS) ×3 IMPLANT
ENDOLOOP SUT PDS II  0 18 (SUTURE)
ENDOLOOP SUT PDS II 0 18 (SUTURE) IMPLANT
GAUZE SPONGE 2X2 8PLY STRL LF (GAUZE/BANDAGES/DRESSINGS) ×1 IMPLANT
GLOVE ECLIPSE 8.0 STRL XLNG CF (GLOVE) ×3 IMPLANT
GLOVE INDICATOR 8.0 STRL GRN (GLOVE) ×3 IMPLANT
GOWN STRL REUS W/TWL XL LVL3 (GOWN DISPOSABLE) ×9 IMPLANT
IRRIG SUCT STRYKERFLOW 2 WTIP (MISCELLANEOUS) ×3
IRRIGATION SUCT STRKRFLW 2 WTP (MISCELLANEOUS) ×1 IMPLANT
KIT BASIN OR (CUSTOM PROCEDURE TRAY) ×3 IMPLANT
PAD POSITIONING PINK XL (MISCELLANEOUS) ×3 IMPLANT
POUCH SPECIMEN RETRIEVAL 10MM (ENDOMECHANICALS) ×3 IMPLANT
RELOAD 45 VASCULAR/THIN (ENDOMECHANICALS) IMPLANT
RELOAD STAPLE TA45 3.5 REG BLU (ENDOMECHANICALS) ×3 IMPLANT
SCISSORS LAP 5X35 DISP (ENDOMECHANICALS) ×3 IMPLANT
SHEARS HARMONIC ACE PLUS 36CM (ENDOMECHANICALS) IMPLANT
SLEEVE XCEL OPT CAN 5 100 (ENDOMECHANICALS) ×3 IMPLANT
SPONGE GAUZE 2X2 STER 10/PKG (GAUZE/BANDAGES/DRESSINGS) ×2
SUT MNCRL AB 4-0 PS2 18 (SUTURE) ×3 IMPLANT
SUT PDS AB 0 CT1 36 (SUTURE) IMPLANT
SUT PDS AB 1 CT1 27 (SUTURE) IMPLANT
SUT SILK 2 0 SH (SUTURE) IMPLANT
TOWEL OR 17X26 10 PK STRL BLUE (TOWEL DISPOSABLE) ×3 IMPLANT
TRAY LAPAROSCOPIC (CUSTOM PROCEDURE TRAY) ×3 IMPLANT
TROCAR BLADELESS OPT 5 100 (ENDOMECHANICALS) ×3 IMPLANT
TROCAR XCEL 12X100 BLDLESS (ENDOMECHANICALS) ×3 IMPLANT
TUBING INSUF HEATED (TUBING) ×3 IMPLANT

## 2016-12-27 NOTE — Op Note (Signed)
PATIENT:  Diane Mclaughlin  21 y.o. female  Patient Care Team: Pa, [facility] as PCP - Erline Hau, MD as Consulting Physician (General Surgery)  PRE-OPERATIVE DIAGNOSIS:  Appendicitis with perforation and abscess status post antibiotics and percutaneous drainage.  Need for interval appendectomy.  POST-OPERATIVE DIAGNOSIS:    Appendicitis with perforation and abscess status post antibiotics and percutaneous drainage.  Need for interval appendectomy.  Chronic appendicitis  PROCEDURE:   APPENDECTOMY LAPAROSCOPIC WITH DIAGNOSTIC LAPAROSCOPY  SURGEON:  Ardeth Sportsman, MD  ANESTHESIA:   local and general  EBL:  Total I/O In: -  Out: 50 [Blood:50]  Delay start of Pharmacological VTE agent (>24hrs) due to surgical blood loss or risk of bleeding:  no  DRAINS: none   SPECIMEN:   APPENDIX  DISPOSITION OF SPECIMEN:  PATHOLOGY  COUNTS:  YES  PLAN OF CARE: Discharge to home after PACU  PATIENT DISPOSITION:  PACU - hemodynamically stable.   INDICATIONS: Patient with concerning symptoms & work up suspicious for appendicitis.  Surgery was recommended:  The anatomy & physiology of the digestive tract was discussed.  The pathophysiology of appendicitis was discussed.  Natural history risks without surgery was discussed.   I feel the risks of no intervention will lead to serious problems that outweigh the operative risks; therefore, I recommended diagnostic laparoscopy with removal of appendix to remove the pathology.  Laparoscopic & open techniques were discussed.   I noted a good likelihood this will help address the problem.    Risks such as bleeding, infection, abscess, leak, reoperation, possible ostomy, hernia, heart attack, death, and other risks were discussed.  Goals of post-operative recovery were discussed as well.  We will work to minimize complications.  Questions were answered.  The patient expresses understanding & wishes to proceed with  surgery.  OR FINDINGS: Inflammation distal half of appendix going down the pelvis consistent with chronic appendicitis.  Right ovarian benign cyst.  DESCRIPTION:   The patient was identified & brought into the operating room. The patient was positioned supine with arms tucked. SCDs were active during the entire case. The patient underwent general anesthesia without any difficulty.  The abdomen was prepped and draped in a sterile fashion. A Surgical Timeout confirmed our plan.  I made a transverse incision through the superior umbilical fold.  I made a small transverse nick through the infraumbilical fascia and confirmed peritoneal entry.  I placed a 5mm port.  We induced carbon dioxide insufflation.  Camera inspection revealed no injury.  I placed additional ports under direct laparoscopic visualization.  I mobilized the terminal ileum to proximal ascending colon in a lateral to medial fashion.  I took care to avoid injuring any retroperitoneal structures.  I freed the appendix off its attachments to the retroperitoneum and right adnexa.  It was not retrocecal.  I elevated the appendix. I skeletonized the mesoappendix. I was able to free off the base of the appendix which was still viable.  I stapled the appendix off the cecum using a laparoscopic stapler. I took a healthy cuff of viable cecum. I ligated the mesoappendix and assured hemostasis in the mesentery.  I placed the appendix inside an EndoCatch bag and removed out the 12 mm port.  I did copious irrigation. Hemostasis was good in the mesoappendix, colon mesentery, and retroperitoneum. Staple line was intact on the cecum with no bleeding. I washed out the pelvis, retrohepatic space and right paracolic gutter. I washed out the left side as well.  Hemostasis is good. There was no perforation or injury. Because the area cleaned up well after irrigation, I did not place a drain.   I closed the 12 mm stapler port site with a 0 Vicryl stitch  using a  suture passer under direct laparoscopic visualization.   I aspirated the carbon dioxide. I removed the ports.  I closed skin using 4-0 monocryl stitch.  Sterile dressings applied.  Patient was extubated and sent to the recovery room.  I discussed the operative findings with the patient's family. I suspect the patient should be able to go home later today if no more further antibiotics since this is an interval appendectomy in there is no evidence of persistent peritonitis or abscess.  Questions answered. They expressed understanding and appreciation.  Ardeth SportsmanSteven C. Christiana Gurevich, M.D., F.A.C.S. Gastrointestinal and Minimally Invasive Surgery [facility] [address] [place], [place] [id number] [phone] Main / Paging  12/27/2016 8:49 AM

## 2016-12-27 NOTE — Anesthesia Postprocedure Evaluation (Signed)
Anesthesia Post Note  Patient: Diane Mclaughlin  Procedure(s) Performed: Procedure(s) (LRB): APPENDECTOMY LAPAROSCOPIC WITH DIAGNOSTIC LAPAROSCOPY (N/A)  Patient location during evaluation: PACU Anesthesia Type: General Level of consciousness: awake and alert and patient cooperative Pain management: pain level controlled Vital Signs Assessment: post-procedure vital signs reviewed and stable Respiratory status: spontaneous breathing and respiratory function stable Cardiovascular status: stable Anesthetic complications: no       Last Vitals:  Vitals:   12/27/16 1012 12/27/16 1149  BP: 125/74 121/75  Pulse: 76 81  Resp: 15 18  Temp: 36.6 C 36.9 C    Last Pain:  Vitals:   12/27/16 1149  TempSrc: Oral  PainSc: 3                  Julia Alkhatib S

## 2016-12-27 NOTE — Discharge Instructions (Signed)
General Anesthesia, Adult, Care After These instructions provide you with information about caring for yourself after your procedure. Your health care provider may also give you more specific instructions. Your treatment has been planned according to current medical practices, but problems sometimes occur. Call your health care provider if you have any problems or questions after your procedure. What can I expect after the procedure? After the procedure, it is common to have:  Vomiting.  A sore throat.  Mental slowness. It is common to feel:  Nauseous.  Cold or shivery.  Sleepy.  Tired.  Sore or achy, even in parts of your body where you did not have surgery. Follow these instructions at home: For at least 24 hours after the procedure:   Do not:  Participate in activities where you could fall or become injured.  Drive.  Use heavy machinery.  Drink alcohol.  Take sleeping pills or medicines that cause drowsiness.  Make important decisions or sign legal documents.  Take care of children on your own.  Rest. Eating and drinking   If you vomit, drink water, juice, or soup when you can drink without vomiting.  Drink enough fluid to keep your urine clear or pale yellow.  Make sure you have little or no nausea before eating solid foods.  Follow the diet recommended by your health care provider. General instructions   Have a responsible adult stay with you until you are awake and alert.  Return to your normal activities as told by your health care provider. Ask your health care provider what activities are safe for you.  Take over-the-counter and prescription medicines only as told by your health care provider.  If you smoke, do not smoke without supervision.  Keep all follow-up visits as told by your health care provider. This is important. Contact a health care provider if:  You continue to have nausea or vomiting at home, and medicines are not helpful.  You  cannot drink fluids or start eating again.  You cannot urinate after 8-12 hours.  You develop a skin rash.  You have fever.  You have increasing redness at the site of your procedure. Get help right away if:  You have difficulty breathing.  You have chest pain.  You have unexpected bleeding.  You feel that you are having a life-threatening or urgent problem. This information is not intended to replace advice given to you by your health care provider. Make sure you discuss any questions you have with your health care provider. Document Released: 11/04/2000 Document Revised: 01/01/2016 Document Reviewed: 07/13/2015 Elsevier Interactive Patient Education  2017 [employer] LAPAROSCOPIC SURGERY: POST OP INSTRUCTIONS  ######################################################################  EAT Gradually transition to a high fiber diet with a fiber supplement over the next few weeks after discharge.  Start with a pureed / full liquid diet (see below)  WALK Walk an hour a day.  Control your pain to do that.    CONTROL PAIN Control pain so that you can walk, sleep, tolerate sneezing/coughing, go up/down stairs.  HAVE A BOWEL MOVEMENT DAILY Keep your bowels regular to avoid problems.  OK to try a laxative to override constipation.  OK to use an antidairrheal to slow down diarrhea.  Call if not better after 2 tries  CALL IF YOU HAVE PROBLEMS/CONCERNS Call if you are still struggling despite following these instructions. Call if you have concerns not answered by these instructions  ######################################################################    1. DIET: Follow a light bland diet the first 24 hours after  arrival home, such as soup, liquids, crackers, etc.  Be sure to include lots of fluids daily.  Avoid fast food or heavy meals as your are more likely to get nauseated.  Eat a low fat the next few days after surgery.   2. Take your usually prescribed home medications  unless otherwise directed. 3. PAIN CONTROL: a. Pain is best controlled by a usual combination of three different methods TOGETHER: i. Ice/Heat ii. Over the counter pain medication iii. Prescription pain medication b. Most patients will experience some swelling and bruising around the incisions.  Ice packs or heating pads (30-60 minutes up to 6 times a day) will help. Use ice for the first few days to help decrease swelling and bruising, then switch to heat to help relax tight/sore spots and speed recovery.  Some people prefer to use ice alone, heat alone, alternating between ice & heat.  Experiment to what works for you.  Swelling and bruising can take several weeks to resolve.   c. It is helpful to take an over-the-counter pain medication regularly for the first few weeks.  Choose one of the following that works best for you: i. Naproxen (Aleve, etc)  Two 220mg  tabs twice a day ii. Ibuprofen (Advil, etc) Three 200mg  tabs four times a day (every meal & bedtime) iii. Acetaminophen (Tylenol, etc) 500-650mg  four times a day (every meal & bedtime) d. A  prescription for pain medication (such as oxycodone, hydrocodone, etc) should be given to you upon discharge.  Take your pain medication as prescribed.  i. If you are having problems/concerns with the prescription medicine (does not control pain, nausea, vomiting, rash, itching, etc), please call us [phone] to see if we need to switch you to a different pain medicine that will work better for you and/or control your side effect better. ii. If you need a refill on your pain medication, please contact your pharmacy.  They will contact our office to request authorization. Prescriptions will not be filled after 5 pm or on week-ends. 4. Avoid getting constipated.  Between the surgery and the pain medications, it is common to experience some constipation.  Increasing fluid intake and taking a fiber supplement (such as Metamucil, Citrucel, FiberCon,  MiraLax, etc) 1-2 times a day regularly will usually help prevent this problem from occurring.  A mild laxative (prune juice, Milk of Magnesia, MiraLax, etc) should be taken according to package directions if there are no bowel movements after 48 hours.   5. Watch out for diarrhea.  If you have many loose bowel movements, simplify your diet to bland foods & liquids for a few days.  Stop any stool softeners and decrease your fiber supplement.  Switching to mild anti-diarrheal medications (Kayopectate, Pepto Bismol) can help.  If this worsens or does not improve, please call us. 6. Wash / shower every day.  You may shower over the dressings as they are waterproof.  Continue to shower over incision(s) after the dressing is off. 7. Remove your waterproof bandages 5 days after surgery.  You may leave the incision open to air.  You may replace a dressing/Band-Aid to cover the incision for comfort if you wish.  8. ACTIVITIES as tolerated:   a. You may resume regular (light) daily activities beginning the next day--such as daily self-care, walking, climbing stairs--gradually increasing activities as tolerated.  If you can walk 30 minutes without difficulty, it is safe to try more intense activity such as jogging, treadmill, bicycling, low-impact aerobics, swimming, etc. b.  Save the most intensive and strenuous activity for last such as sit-ups, heavy lifting, contact sports, etc  Refrain from any heavy lifting or straining until you are off narcotics for pain control.   c. DO NOT PUSH THROUGH PAIN.  Let pain be your guide: If it hurts to do something, don't do it.  Pain is your body warning you to avoid that activity for another week until the pain goes down. d. You may drive when you are no longer taking prescription pain medication, you can comfortably wear a seatbelt, and you can safely maneuver your car and apply brakes. e. Bonita QuinYou may have sexual intercourse when it is comfortable.  9. FOLLOW UP in our  office a. Please call [facility] at [phone] to set up an appointment to see your surgeon in the office for a follow-up appointment approximately 2-3 weeks after your surgery. b. Make sure that you call for this appointment the day you arrive home to insure a convenient appointment time. 10. IF YOU HAVE DISABILITY OR FAMILY LEAVE FORMS, BRING THEM TO THE OFFICE FOR PROCESSING.  DO NOT GIVE THEM TO YOUR DOCTOR.   WHEN TO CALL US [phone]: 1. Poor pain control 2. Reactions / problems with new medications (rash/itching, nausea, etc)  3. Fever over 101.5 F (38.5 C) 4. Inability to urinate 5. Nausea and/or vomiting 6. Worsening swelling or bruising 7. Continued bleeding from incision. 8. Increased pain, redness, or drainage from the incision   The clinic staff is available to answer your questions during regular business hours (8:30am-5pm).  Please dont hesitate to call and ask to speak to one of our nurses for clinical concerns.   If you have a medical emergency, go to the nearest emergency room or call 911.  [facility] always [facility] at the [facility]   [facility] [place] [address], [address], [place], [place]  [address] ? MAIN: [phone] ? TOLL FREE: [phone] ?  FAX [phone] [email or web address]    Appendicitis The appendix is a finger-shaped tube that is attached to the large intestine. Appendicitis is inflammation of the appendix. Without treatment, appendicitis can cause the appendix to tear (rupture). A ruptured appendix can lead to a life-threatening infection. It can also lead to the formation of a painful collection of pus (abscess) in the appendix. What are the causes? This condition may be caused by a blockage in the appendix that leads to infection. The blockage can be due to:  A ball of stool.  Enlarged lymph glands. In some cases, the cause may not be known. What increases the  risk? This condition is more likely to develop in people who are 3710-21 years of age. What are the signs or symptoms? Symptoms of this condition include:  Pain around the belly button that moves toward the lower right abdomen. The pain can become more severe as time passes. It gets worse with coughing or sudden movements.  Tenderness in the lower right abdomen.  Nausea.  Vomiting.  Loss of appetite.  Fever.  Constipation.  Diarrhea.  Generally not feeling well. How is this diagnosed? This condition may be diagnosed with:  A physical exam.  Blood tests.  Urine test. To confirm the diagnosis, an ultrasound, MRI, or CT scan may be done. How is this treated? This condition is usually treated by taking out the appendix (appendectomy). There are two methods for doing an appendectomy:  Open appendectomy. In this surgery, the appendix  is removed through a large cut (incision) that is made in the lower right abdomen. This procedure may be recommended if:  You have major scarring from a previous surgery.  You have a bleeding disorder.  You are pregnant and are near term.  You have a condition that makes the laparoscopic procedure impossible, such as an advanced infection or a ruptured appendix.  Laparoscopic appendectomy. In this surgery, the appendix is removed through small incisions. This procedure usually causes less pain and fewer problems than an open appendectomy. It also has a shorter recovery time. If the appendix has ruptured and an abscess has formed, a drain may be placed into the abscess to remove fluid and antibiotic medicines may be given through an IV tube. The appendix may or may not need to be removed. This information is not intended to replace advice given to you by your health care provider. Make sure you discuss any questions you have with your health care provider. Document Released: 07/29/2005 Document Revised: 12/06/2015 Document Reviewed:  12/14/2014 Elsevier Interactive Patient Education  2017 [employer].

## 2016-12-27 NOTE — Transfer of Care (Signed)
Immediate Anesthesia Transfer of Care Note  Patient: Diane Mclaughlin  Procedure(s) Performed: Procedure(s): APPENDECTOMY LAPAROSCOPIC WITH DIAGNOSTIC LAPAROSCOPY (N/A)  Patient Location: PACU  Anesthesia Type:General  Level of Consciousness: awake, alert , oriented and patient cooperative  Airway & Oxygen Therapy: Patient Spontanous Breathing and Patient connected to face mask oxygen  Post-op Assessment: Report given to RN, Post -op Vital signs reviewed and stable and Patient moving all extremities  Post vital signs: Reviewed and stable  Last Vitals:  Vitals:   12/27/16 0544  BP: 128/82  Pulse: 86  Resp: 18  Temp: 36.8 C    Last Pain:  Vitals:   12/27/16 0544  TempSrc: Oral      Patients Stated Pain Goal: 4 (12/27/16 0606)  Complications: No apparent anesthesia complications

## 2016-12-27 NOTE — Anesthesia Preprocedure Evaluation (Signed)
Anesthesia Evaluation  Patient identified by MRN, date of birth, ID band Patient awake    Reviewed: Allergy & Precautions, NPO status , Patient's Chart, lab work & pertinent test results  Airway Mallampati: II   Neck ROM: full    Dental   Pulmonary neg pulmonary ROS,    breath sounds clear to auscultation       Cardiovascular negative cardio ROS   Rhythm:regular Rate:Normal     Neuro/Psych    GI/Hepatic Appendicitis.  Peri-appendiceal abscess   Endo/Other  obese  Renal/GU      Musculoskeletal   Abdominal   Peds  Hematology   Anesthesia Other Findings   Reproductive/Obstetrics                             Anesthesia Physical Anesthesia Plan  ASA: II  Anesthesia Plan: General   Post-op Pain Management:    Induction: Intravenous  Airway Management Planned: Oral ETT  Additional Equipment:   Intra-op Plan:   Post-operative Plan: Extubation in OR  Informed Consent: I have reviewed the patients History and Physical, chart, labs and discussed the procedure including the risks, benefits and alternatives for the proposed anesthesia with the patient or authorized representative who has indicated his/her understanding and acceptance.     Plan Discussed with: CRNA, Anesthesiologist and Surgeon  Anesthesia Plan Comments:         Anesthesia Quick Evaluation

## 2016-12-27 NOTE — Anesthesia Procedure Notes (Addendum)
Procedure Name: Intubation Date/Time: 12/27/2016 7:39 AM Performed by: Carleene Cooper A Pre-anesthesia Checklist: Patient identified, Emergency Drugs available, Suction available, Patient being monitored and Timeout performed Patient Re-evaluated:Patient Re-evaluated prior to inductionOxygen Delivery Method: Circle system utilized Preoxygenation: Pre-oxygenation with 100% oxygen Intubation Type: IV induction Ventilation: Mask ventilation without difficulty Laryngoscope Size: Mac and 3 Grade View: Grade I Tube type: Oral Tube size: 7.0 mm Number of attempts: 1 Airway Equipment and Method: Stylet Placement Confirmation: ETT inserted through vocal cords under direct vision,  positive ETCO2 and breath sounds checked- equal and bilateral Secured at: 22 cm Tube secured with: Tape Dental Injury: Teeth and Oropharynx as per pre-operative assessment

## 2016-12-27 NOTE — Interval H&P Note (Signed)
History and Physical Interval Note:  12/27/2016 7:18 AM  Diane Mclaughlin  has presented today for surgery, with the diagnosis of Appendicitis with perforation and abscess status post antibiotics and percutaneous drainage.  Need for interval appendectomy.  The various methods of treatment have been discussed with the patient and family. After consideration of risks, benefits and other options for treatment, the patient has consented to  Procedure(s): APPENDECTOMY LAPAROSCOPIC WITH DIAGNOSTIC LAPAROSCOPY (N/A) as a surgical intervention .  The patient's history has been reviewed, patient examined, no change in status, stable for surgery.  I have reviewed the patient's chart and labs.  Questions were answered to the patient's satisfaction.     [name] C.

## 2016-12-27 NOTE — H&P (Signed)
Diane MichaelisDevin Mclaughlin 11/21/2016 11:05 AM Location: [facility] Patient #: [id number] DOB: 11/11/1995 Single / Language: [place] / Race: White Female  Patient Care Team: [place], [facility] - Erline HauGeneral Reyah Streeter, MD as Consulting Physician (General Surgery)    History of Present Illness Diane Mclaughlin(Diane Mclaughlin C. Marcena Dias MD; 11/21/2016 2:20 PM) The patient is a 21 year old female who presents for an intra-abdominal abscess. Note for "Intra-abdominal abscess": ` ` ` Patient returns status post management of a perforated appendicitis with an abscess.  21 year old female admitted March 29. Presumed perforated appendicitis inflammation. Placed on antibiotics. Follow-up CT scan noted a discrete abscess. Underwent percutaneous drainage on 11/13/2016. Cultures negative.  Patient returns with her mother. She is feeling better. Not totally normal lobe. Appetite coming back. She was having loose bowel movements. There began to be a little more normal.-year-old woman. She had lab work done. That's not back yet. No fevers chills or sweats. No nausea or vomiting. Minimal soreness and lower abdomen. Certainly markedly improved from the severe pain with her perforated appendicitis. Back to school. The patient and her mother worried about recurrent appendicitis in her interest in the idea appendectomy at some point.  No personal nor family history of GI/colon cancer, inflammatory bowel disease, irritable bowel syndrome, allergy such as Celiac Sprue, dietary/dairy problems, colitis, ulcers nor gastritis. No recent sick contacts/gastroenteritis. No travel outside the country. No changes in diet. No dysphagia to solids or liquids. No significant heartburn or reflux. No hematochezia, hematemesis, coffee ground emesis. No evidence of prior gastric/peptic ulceration.   Past Surgical History (Janette Ranson, CMA; 11/21/2016 11:05 AM) No pertinent past surgical history   Diagnostic Studies  History (Janette Ranson, CMA; 11/21/2016 11:05 AM) Colonoscopy  never  Medication History (Janette Ranson, CMA; 11/21/2016 11:10 AM) Acetaminophen (500MG  Tablet, Oral) Active. Flonase Allergy Relief (50MCG/ACT Suspension, Nasal) Active. MiraLax (Oral) Active. Florastor (250MG  Capsule, Oral) Active. Medications Reconciled  Social History (Janette Ranson, CMA; 11/21/2016 11:05 AM) Alcohol use  Occasional alcohol use. Caffeine use  Carbonated beverages. No drug use  Tobacco use  Never smoker.  Family History (Janette Ranson, CMA; 11/21/2016 11:05 AM) Alcohol Abuse  Family Members In General. Arthritis  Family Members In General. Malignant Neoplasm Of Pancreas  Family Members In General. Respiratory Condition  Father.  Other Problems (Janette Ranson, CMA; 11/21/2016 11:05 AM) Other disease, cancer, significant illness     Review of Systems (Janette Ranson CMA; 11/21/2016 11:05 AM) General Present- Fatigue. Not Present- Appetite Loss, Chills, Fever, Night Sweats, Weight Gain and Weight Loss. Skin Not Present- Change in Wart/Mole, Dryness, Hives, Jaundice, New Lesions, Non-Healing Wounds, Rash and Ulcer. Respiratory Not Present- Bloody sputum, Chronic Cough, Difficulty Breathing, Snoring and Wheezing. Breast Not Present- Breast Mass, Breast Pain, Nipple Discharge and Skin Changes. Cardiovascular Not Present- Chest Pain, Difficulty Breathing Lying Down, Leg Cramps, Palpitations, Rapid Heart Rate, Shortness of Breath and Swelling of Extremities. Gastrointestinal Present- Abdominal Pain and Gets full quickly at meals. Not Present- Bloating, Bloody Stool, Change in Bowel Habits, Chronic diarrhea, Constipation, Difficulty Swallowing, Excessive gas, Hemorrhoids, Indigestion, Nausea, Rectal Pain and Vomiting. Female Genitourinary Not Present- Blood in Urine, Change in Urinary Stream, Frequency, Impotence, Nocturia, Painful Urination, Urgency and Urine Leakage. Musculoskeletal Not  Present- Back Pain, Joint Pain, Joint Stiffness, Muscle Pain, Muscle Weakness and Swelling of Extremities. Neurological Not Present- Decreased Memory, Fainting, Headaches, Numbness, Seizures, Tingling, Tremor, Trouble walking and Weakness. Psychiatric Present- Anxiety and Change in Sleep Pattern. Not Present- Bipolar, Depression, Fearful and Frequent crying.  Endocrine Not Present- Cold Intolerance, Excessive Hunger, Hair Changes, Heat Intolerance, Hot flashes and New Diabetes. Hematology Not Present- Blood Thinners, Easy Bruising, Excessive bleeding, Gland problems, HIV and Persistent Infections.  Vitals (Janette Ranson CMA; 11/21/2016 11:11 AM) 11/21/2016 11:10 AM Weight: 227 lb Height: 67in Body Surface Area: 2.13 m Body Mass Index: 35.55 kg/m  Temp.: 98.70F  Pulse: 91 (Regular)  BP: 122/82 (Sitting, Left Arm, Standard)       Physical Exam Diane Sportsman MD; 11/21/2016 2:21 PM) General Mental Status-Alert. General Appearance-Not in acute distress, Not Sickly. Orientation-Oriented X3. Hydration-Well hydrated. Voice-Normal.  Integumentary Global Assessment Upon inspection and palpation of skin surfaces of the - Axillae: non-tender, no inflammation or ulceration, no drainage. and Distribution of scalp and body hair is normal. General Characteristics Temperature - normal warmth is noted.  Head and Neck Head-normocephalic, atraumatic with no lesions or palpable masses. Face Global Assessment - atraumatic, no absence of expression. Neck Global Assessment - no abnormal movements, no bruit auscultated on the right, no bruit auscultated on the left, no decreased range of motion, non-tender. Trachea-midline. Thyroid Gland Characteristics - non-tender.  Eye Eyeball - Left-Extraocular movements intact, No Nystagmus. Eyeball - Right-Extraocular movements intact, No Nystagmus. Cornea - Left-No Hazy. Cornea - Right-No Hazy. Sclera/Conjunctiva -  Left-No scleral icterus, No Discharge. Sclera/Conjunctiva - Right-No scleral icterus, No Discharge. Pupil - Left-Direct reaction to light normal. Pupil - Right-Direct reaction to light normal.  ENMT Ears Pinna - Left - no drainage observed, no generalized tenderness observed. Right - no drainage observed, no generalized tenderness observed. Nose and Sinuses External Inspection of the Nose - no destructive lesion observed. Inspection of the nares - Left - quiet respiration. Right - quiet respiration. Mouth and Throat Lips - Upper Lip - no fissures observed, no pallor noted. Lower Lip - no fissures observed, no pallor noted. Nasopharynx - no discharge present. Oral Cavity/Oropharynx - Tongue - no dryness observed. Oral Mucosa - no cyanosis observed. Hypopharynx - no evidence of airway distress observed.  Chest and Lung Exam Inspection Movements - Normal and Symmetrical. Accessory muscles - No use of accessory muscles in breathing. Palpation Palpation of the chest reveals - Non-tender. Auscultation Breath sounds - Normal and Clear.  Cardiovascular Auscultation Rhythm - Regular. Murmurs & Other Heart Sounds - Auscultation of the heart reveals - No Murmurs and No Systolic Clicks.  Abdomen Inspection Inspection of the abdomen reveals - No Visible peristalsis and No Abnormal pulsations. Umbilicus - No Bleeding, No Urine drainage. Palpation/Percussion Palpation and Percussion of the abdomen reveal - Soft, Non Tender, No Rebound tenderness, No Rigidity (guarding) and No Cutaneous hyperesthesia. Note: Abdomen soft. Minimal soreness and lower abdomen but vague and nonspecific. Rest of abdomen nontender, nondistended. No guarding. No diastasis. No umbilical nor other hernias   Peripheral Vascular Upper Extremity Inspection - Left - No Cyanotic nailbeds, Not Ischemic. Right - No Cyanotic nailbeds, Not Ischemic.  Neurologic Neurologic evaluation reveals -normal attention span and  ability to concentrate, able to name objects and repeat phrases. Appropriate fund of knowledge , normal sensation and normal coordination. Mental Status Affect - not angry, not paranoid. Cranial Nerves-Normal Bilaterally. Gait-Normal.  Neuropsychiatric Mental status exam performed with findings of-able to articulate well with normal speech/language, rate, volume and coherence, thought content normal with ability to perform basic computations and apply abstract reasoning and no evidence of hallucinations, delusions, obsessions or homicidal/suicidal ideation.  Musculoskeletal Global Assessment Spine, Ribs and Pelvis - no instability, subluxation or laxity. Right Upper Extremity - no  instability, subluxation or laxity.  Lymphatic Head & Neck  General Head & Neck Lymphatics: Bilateral - Description - No Localized lymphadenopathy. Axillary  General Axillary Region: Bilateral - Description - No Localized lymphadenopathy. Femoral & Inguinal  Generalized Femoral & Inguinal Lymphatics: Left - Description - No Localized lymphadenopathy. Right - Description - No Localized lymphadenopathy.     ACUTE APPENDICITIS WITH PERITONEAL ABSCESS (K35.3) Impression: Improving status post IV antibiotics and aspiration. Just completed around antibiotic therapy.  Follow up on labs from yesterday off antibiotics.  Given her young age and significant inflammation, my instinct would be to plan an interval appendectomy 6 weeks in late May/early June. Both are interested in appendectomy to avoid a future attack. She would like to wait until that point after school and have an of time during summer break before she has to focus on the next phase in August. Other option is just observation only, but I not seen great success with expected management after perforated appendicitis, especially in someone so young with much life left in her particular to future problems. The patient and her mother would rest to be  aggressive and avoid future risks. They agree with proceeding. Hopefully an outpatient laparoscopic appendectomy. Current Plans  Pt Education - Appendicitis: discussed with patient and provided information. PREOP COLON - ENCOUNTER FOR PREOPERATIVE EXAMINATION FOR GENERAL SURGICAL PROCEDURE (Z01.818) Current Plans You are being scheduled for surgery- Our schedulers will call you.  You should hear from our office's scheduling department within 5 working days about the location, date, and time of surgery. We try to make accommodations for patient's preferences in scheduling surgery, but sometimes the OR schedule or the surgeon's schedule prevents [place] from making those accommodations.  If you have not heard from our office [phone]) in 5 working days, call the office and ask for your surgeon's nurse.  If you have other questions about your diagnosis, plan, or surgery, call the office and ask for your surgeon's nurse.  The anatomy & physiology of the digestive tract was discussed. The pathophysiology of appendicitis and other appendiceal disorders were discussed. Natural history risks without surgery was discussed. I feel the risks of no intervention will lead to serious problems that outweigh the operative risks; therefore, I recommended diagnostic laparoscopy with removal of appendix to remove the pathology. Laparoscopic & open techniques were discussed. I noted a good likelihood this will help address the problem. Risks such as bleeding, infection, abscess, leak, reoperation, possible ostomy, hernia, heart attack, death, and other risks were discussed. Goals of post-operative recovery were discussed as well. We will work to minimize complications. Questions were answered. The patient expresses understanding & wishes to proceed with surgery.  Written instructions provided The anatomy & physiology of the digestive tract was discussed. The pathophysiology of the colon was discussed.  Natural history risks without surgery was discussed. I feel the risks of no intervention will lead to serious problems that outweigh the operative risks; therefore, I recommended a partial colectomy to remove the pathology. Minimally invasive (Robotic/Laparoscopic) & open techniques were discussed.  Risks such as bleeding, infection, abscess, leak, reoperation, possible ostomy, hernia, heart attack, death, and other risks were discussed. I noted a good likelihood this will help address the problem. Goals of post-operative recovery were discussed as well. Need for adequate nutrition, daily bowel regimen and healthy physical activity, to optimize recovery was noted as well. We will work to minimize complications. Educational materials were available as well. Questions were answered. The patient expresses understanding & wishes  to proceed with surgery.  Pt Education - [facility] Colectomy post-op instructions: discussed with patient and provided information. Pt Education - [facility] Rectal Prep for Anorectal outpatient/office surgery: discussed with patient and provided information.  Diane Mclaughlin, M.D., F.A.C.S. Gastrointestinal and Minimally Invasive Surgery [facility] [address] [place], [place] [address] [phone] Main / Paging

## 2016-12-30 ENCOUNTER — Encounter (HOSPITAL_COMMUNITY): Payer: Self-pay | Admitting: Surgery

## 2019-12-03 ENCOUNTER — Other Ambulatory Visit (HOSPITAL_COMMUNITY)
Admission: RE | Admit: 2019-12-03 | Discharge: 2019-12-03 | Disposition: A | Discharge status: Home / Self Care | Payer: BC Managed Care – PPO | Source: Ambulatory Visit | Attending: Family Medicine | Admitting: Family Medicine

## 2019-12-03 ENCOUNTER — Other Ambulatory Visit: Payer: Self-pay | Admitting: Family Medicine

## 2019-12-03 DIAGNOSIS — Z0000 Encounter for general adult medical examination without abnormal findings: Secondary | ICD-10-CM | POA: Insufficient documentation

## 2019-12-07 LAB — CYTOLOGY - PAP: Diagnosis: NEGATIVE

## 2021-12-29 ENCOUNTER — Other Ambulatory Visit: Payer: Self-pay

## 2021-12-29 ENCOUNTER — Emergency Department (HOSPITAL_COMMUNITY)
Admission: EM | Admit: 2021-12-29 | Discharge: 2021-12-29 | Disposition: A | Discharge status: Home / Self Care | Payer: BC Managed Care – PPO | Attending: Student | Admitting: Student

## 2021-12-29 ENCOUNTER — Emergency Department (HOSPITAL_COMMUNITY): Payer: BC Managed Care – PPO

## 2021-12-29 ENCOUNTER — Encounter (HOSPITAL_COMMUNITY): Payer: Self-pay | Admitting: Emergency Medicine

## 2021-12-29 DIAGNOSIS — R519 Headache, unspecified: Secondary | ICD-10-CM | POA: Insufficient documentation

## 2021-12-29 MED ORDER — PROCHLORPERAZINE EDISYLATE 10 MG/2ML IJ SOLN
10.0000 mg | Freq: Once | INTRAMUSCULAR | Status: AC
  Administered 2021-12-29: 10 mg via INTRAMUSCULAR
  Filled 2021-12-29: qty 2

## 2021-12-29 MED ORDER — KETOROLAC TROMETHAMINE 15 MG/ML IJ SOLN
15.0000 mg | Freq: Once | INTRAMUSCULAR | Status: AC
  Administered 2021-12-29: 15 mg via INTRAMUSCULAR
  Filled 2021-12-29: qty 1

## 2021-12-29 NOTE — ED Triage Notes (Signed)
Patient c/o intermittent headaches for months. Last two episodes of headache have been accompanied with vision changes in R eye. Seen at [facility] today and sent for further evaluation. Family history of migraines. Denies N/V.

## 2021-12-29 NOTE — ED Provider Notes (Signed)
[facility]-EMERGENCY DEPT Provider Note   CSN: [id number] Arrival date & time: 12/29/21  1719     History  Chief Complaint  Patient presents with   Headache    Diane Mclaughlin is a 26 y.o. female.  26 year old female presents with her mom today for evaluation of headache.  Headache has been ongoing for a few years however over the past few months has been more frequent and since 5/7 she has had a visual aura as well as 2 episodes of right-sided paresthesias.  Currently endorses a mild headache without visual change, or paresthesias.  Denies any weakness since onset of of her headaches, no pain with her visual change.  No vision loss.  Family history significant for migraines in her dad.  Denies fever or neck pain.  The history is provided by the patient and a parent. No language interpreter was used.      Home Medications Prior to Admission medications   Medication Sig Start Date End Date Taking? Authorizing Provider  fluticasone (FLONASE) 50 MCG/ACT nasal spray Place 1 spray into both nostrils daily as needed for allergies or rhinitis.    [provider]  naproxen (NAPROSYN) 500 MG tablet Take 1 tablet (500 mg total) by mouth every 12 (twelve) hours as needed for mild pain or moderate pain. 12/27/16   Karie Soda, MD  polyethylene glycol (MIRALAX / Ethelene Hal) packet Take 17 g by mouth daily as needed.    [provider]  traMADol (ULTRAM) 50 MG tablet Take 1-2 tablets (50-100 mg total) by mouth every 6 (six) hours as needed for moderate pain or severe pain. 12/27/16   Karie Soda, MD      Allergies    Lactose intolerance (gi)    Review of Systems   Review of Systems  Constitutional:  Negative for chills and fever.  Eyes:  Positive for visual disturbance.  Gastrointestinal:  Negative for nausea and vomiting.  Neurological:  Positive for headaches. Negative for weakness and light-headedness.  All other systems reviewed and are  negative.  Physical Exam Updated Vital Signs BP (!) 130/91   Pulse 75   Temp (!) 97.5 F (36.4 C) (Oral)   Resp 16   LMP 12/16/2021 (Approximate)   SpO2 98%  Physical Exam Vitals and nursing note reviewed.  Constitutional:      General: She is not in acute distress.    Appearance: Normal appearance. She is not ill-appearing.  HENT:     Head: Normocephalic and atraumatic.     Nose: Nose normal.  Eyes:     General: No scleral icterus.    Extraocular Movements: Extraocular movements intact.     Conjunctiva/sclera: Conjunctivae normal.     Pupils: Pupils are equal, round, and reactive to light.  Cardiovascular:     Rate and Rhythm: Normal rate and regular rhythm.     Pulses: Normal pulses.     Heart sounds: Normal heart sounds.  Pulmonary:     Effort: Pulmonary effort is normal. No respiratory distress.     Breath sounds: Normal breath sounds. No wheezing or rales.  Abdominal:     General: There is no distension.     Tenderness: There is no abdominal tenderness.  Musculoskeletal:        General: Normal range of motion.     Cervical back: Normal range of motion.  Skin:    General: Skin is warm and dry.  Neurological:     General: No focal deficit present.  Mental Status: She is alert. Mental status is at baseline.     Comments: Cranial nerves III through XII intact.  Without facial droop, or dysarthria.  Full range of motion and 5/5 strength in bilateral upper and lower extremities and extensor and flexor muscle groups.  Sensation intact.    ED Results / Procedures / Treatments   Labs (all labs ordered are listed, but only abnormal results are displayed) Labs Reviewed - No data to display  EKG None  Radiology No results found.  Procedures Procedures    Medications Ordered in ED Medications  prochlorperazine (COMPAZINE) injection 10 mg (10 mg Intramuscular Given 12/29/21 1843)  ketorolac (TORADOL) 15 MG/ML injection 15 mg (15 mg Intramuscular Given 12/29/21  1843)    ED Course/ Medical Decision Making/ A&P                           Medical Decision Making Amount and/or Complexity of Data Reviewed Radiology: ordered.  Risk Prescription drug management.   Medical Decision Making / ED Course   This patient presents to the ED for concern of headache, this involves an extensive number of treatment options, and is a complaint that carries with it a high risk of complications and morbidity.  The differential diagnosis includes migraine, complex migraine, pseudotumor cerebri, tension headache, cluster headache  MDM: Patient with mild headache.  Neurological exam without focal deficits.  2+ patellar reflex present bilaterally.  Migraine cocktail given with resolution of headache.  CT head without acute intracranial findings.  Symptoms concerning for complex migraine versus pseudotumor cerebri.  Discussed with neurology who is in agreement with outpatient work-up.  Ambulatory referral to neurology given.  Discussed importance of follow-up with eye doctor for evaluation of papilledema.  Patient and mom both voiced understanding and are in agreement with plan.  Return precautions discussed.    Additional history obtained: -Additional history obtained from mom who confirms patient's dad had history of migraines. -External records from outside source obtained and reviewed including: Chart review including previous notes, labs, imaging, consultation notes   Lab Tests: -I ordered, reviewed, and interpreted labs.   The pertinent results include:   Labs Reviewed - No data to display    EKG  EKG Interpretation  Date/Time:    Ventricular Rate:    PR Interval:    QRS Duration:   QT Interval:    QTC Calculation:   R Axis:     Text Interpretation:           Imaging Studies ordered: I ordered imaging studies including CT head without contrast I independently visualized and interpreted imaging. I agree with the radiologist  interpretation   Medicines ordered and prescription drug management: Meds ordered this encounter  Medications   prochlorperazine (COMPAZINE) injection 10 mg   ketorolac (TORADOL) 15 MG/ML injection 15 mg    -I have reviewed the patients home medicines and have made adjustments as needed  Consultations Obtained: I requested consultation with the neurology,  and discussed lab and imaging findings as well as pertinent plan - they recommend: Outpatient neurology follow-up and work-up   Reevaluation: After the interventions noted above, I reevaluated the patient and found that they have :resolved  Co morbidities that complicate the patient evaluation  Past Medical History:  Diagnosis Date   Abscess    aspiration of appendix abscess   Constipation, chronic 11/07/2016   Pneumonia age 18   Seasonal allergies 11/07/2016  Dispostion: Patient is appropriate for discharge.  Discharged in stable condition.  Return precautions discussed.  Final Clinical Impression(s) / ED Diagnoses Final diagnoses:  Nonintractable headache, unspecified chronicity pattern, unspecified headache type    Rx / DC Orders ED Discharge Orders          Ordered    Ambulatory referral to Neurology       Comments: An appointment is requested in approximately: 1 week   12/29/21 1958              Marita [place], PA-C 12/29/21 2003    [place], [place], MD 12/30/21 1401

## 2021-12-29 NOTE — Discharge Instructions (Addendum)
Your work-up today was reassuring.  Your headache resolved following migraine cocktail.  Your case with neurologist who is in agreement with outpatient work-up.  I have given you a referral to different neurology.  You should receive a call from their clinic to schedule this appointment.  If you do not hear from them by Tuesday or Wednesday please give their office a call to schedule this appointment.  Otherwise follow-up with your PCP as needed.  If you have any concerning symptoms such as weakness, vision loss, worsening headache please return to the emergency room for evaluation. Follow-up with your eye doctor to evaluate for papilledema.

## 2021-12-29 NOTE — ED Provider Triage Note (Signed)
Emergency Medicine Provider Triage Evaluation Note  Diane Mclaughlin , a 26 y.o. female  was evaluated in triage.  Pt complains of headache.  She has been having intermittent headaches for 2 months followed by her primary.  States she weeks ago she had spots in her left eye.  Yesterday 20 minutes preceding her headache she saw squiggly lines in the background which worried her.  Went to urgent care who sent her to ED for "MS work-up".  Patient denies any vision loss, no painful vision changes.  No nausea or vomiting..  Review of Systems  Per HPI  Physical Exam  BP (!) 133/91 (BP Location: Left Arm)   Pulse 80   Temp (!) 97.5 F (36.4 C) (Oral)   Resp 18   LMP 12/16/2021 (Approximate)   SpO2 99%  Gen:   Awake, no distress   Resp:  Normal effort  MSK:   Moves extremities without difficulty  Other:  Cranial nerves III through XII are grossly intact.  Grips and is equal bilaterally, lower extremity strength equal bilaterally.    Medical Decision Making  Medically screening exam initiated at 5:37 PM.  Appropriate orders placed.  Jaquetta Hartranft was informed that the remainder of the evaluation will be completed by another provider, this initial triage assessment does not replace that evaluation, and the importance of remaining in the ED until their evaluation is complete.  low suspicion for MS, seems like migraine with aura.  Will defer on labs and imaging until seen in back    Sherrill Raring, [place] 12/29/21 1738

## 2022-01-31 ENCOUNTER — Encounter: Payer: Self-pay | Admitting: Neurology

## 2022-01-31 ENCOUNTER — Ambulatory Visit: Payer: BC Managed Care – PPO | Admitting: Neurology

## 2022-01-31 VITALS — BP 124/76 | HR 81 | Ht 67.0 in | Wt 244.8 lb

## 2022-01-31 DIAGNOSIS — G43109 Migraine with aura, not intractable, without status migrainosus: Secondary | ICD-10-CM

## 2022-01-31 DIAGNOSIS — R5383 Other fatigue: Secondary | ICD-10-CM | POA: Diagnosis not present

## 2022-01-31 DIAGNOSIS — G43009 Migraine without aura, not intractable, without status migrainosus: Secondary | ICD-10-CM | POA: Diagnosis not present

## 2022-01-31 MED ORDER — CYCLOBENZAPRINE HCL 10 MG PO TABS: 10.0000 mg | ORAL_TABLET | Freq: Every day | ORAL | 3 refills | Status: DC

## 2022-01-31 MED ORDER — RIZATRIPTAN BENZOATE 10 MG PO TBDP: 10.0000 mg | ORAL_TABLET | ORAL | 11 refills | Status: DC | PRN

## 2022-01-31 MED ORDER — ONDANSETRON 4 MG PO TBDP: 4.0000 mg | ORAL_TABLET | Freq: Three times a day (TID) | ORAL | 3 refills | Status: DC | PRN

## 2022-01-31 NOTE — Patient Instructions (Addendum)
FL-41 filter for glasses(theraspects, somnilight) and blue light filters Emergent: RIGHT at the onset of migraine take Rizatriptan: Please take one tablet at the onset of your headache. If it does not improve the symptoms please take one additional tablet. Do not take more then 2 tablets in 24hrs. Do not take use more then 2 to 3 times in a week. If you don;t like the triptans, would move to the new medications Ubrelvy or Nurtec [email or web address] - dry needling Flexeril at bedtime for neck spasms IF we decide on a preventative, would try Topiramate/Topamax and then move to the newer meds like Nurtec, qulipta.  Keep a journal  Rizatriptan Disintegrating Tablets What is this medication? RIZATRIPTAN (rye za TRIP tan) treats migraines. It works by blocking pain signals and narrowing blood vessels in the brain. It belongs to a group of medications called triptans. It is not used to prevent migraines. This medicine may be used for other purposes; ask your health care provider or pharmacist if you have questions. COMMON BRAND NAME(S): Maxalt-MLT What should I tell my care team before I take this medication? They need to know if you have any of these conditions: Cigarette smoker Circulation problems in fingers and toes Diabetes Heart disease High blood pressure High cholesterol History of irregular heartbeat History of stroke Kidney disease Liver disease Stomach or intestine problems An unusual or allergic reaction to rizatriptan, other medications, foods, dyes, or preservatives Pregnant or trying to get pregnant Breast-feeding How should I use this medication? Take this medication by mouth. Follow the directions on the prescription label. Leave the tablet in the sealed blister pack until you are ready to take it. With dry hands, open the blister and gently remove the tablet. If the tablet breaks or crumbles, throw it away and take a new tablet out of the blister pack. Place the tablet in the mouth  and allow it to dissolve, and then swallow. Do not cut, crush, or chew this medication. You do not need water to take this medication. Do not take it more often than directed. Talk to your care team regarding the use of this medication in children. While this medication may be prescribed for children as young as 6 years for selected conditions, precautions do apply. Overdosage: If you think you have taken too much of this medicine contact a poison control center or emergency room at once. NOTE: This medicine is only for you. Do not share this medicine with others. What if I miss a dose? This does not apply. This medication is not for regular use. What may interact with this medication? Do not take this medication with any of the following medications: Certain medications for migraine headache like almotriptan, eletriptan, frovatriptan, naratriptan, rizatriptan, sumatriptan, zolmitriptan Ergot alkaloids like dihydroergotamine, ergonovine, ergotamine, methylergonovine MAOIs like Carbex, Eldepryl, Marplan, Nardil, and Parnate This medication may also interact with the following medications: Certain medications for depression, anxiety, or psychotic disorders Propranolol This list may not describe all possible interactions. Give your health care provider a list of all the medicines, herbs, non-prescription drugs, or dietary supplements you use. Also tell them if you smoke, drink alcohol, or use illegal drugs. Some items may interact with your medicine. What should I watch for while using this medication? Visit your care team for regular checks on your progress. Tell your care team if your symptoms do not start to get better or if they get worse. You may get drowsy or dizzy. Do not drive, use machinery, or do anything that  needs mental alertness until you know how this medication affects you. Do not stand up or sit up quickly, especially if you are an older patient. This reduces the risk of dizzy or  fainting spells. Alcohol may interfere with the effect of this medication. Your mouth may get dry. Chewing sugarless gum or sucking hard candy and drinking plenty of water may help. Contact your care team if the problem does not go away or is severe. If you take migraine medications for 10 or more days a month, your migraines may get worse. Keep a diary of headache days and medication use. Contact your care team if your migraine attacks occur more frequently. What side effects may I notice from receiving this medication? Side effects that you should report to your care team as soon as possible: Allergic reactions--skin rash, itching, hives, swelling of the face, lips, tongue, or throat Burning, pain, tingling, or color changes in the legs or feet Heart attack--pain or tightness in the chest, shoulders, arms, or jaw, nausea, shortness of breath, cold or clammy skin, feeling faint or lightheaded Heart rhythm changes--fast or irregular heartbeat, dizziness, feeling faint or lightheaded, chest pain, trouble breathing Increase in blood pressure Irritability, confusion, fast or irregular heartbeat, muscle stiffness, twitching muscles, sweating, high fever, seizure, chills, vomiting, diarrhea, which may be signs of serotonin syndrome Raynaud's--cool, numb, or painful fingers or toes that may change color from pale, to blue, to red Seizures Stroke--sudden numbness or weakness of the face, arm, or leg, trouble speaking, confusion, trouble walking, loss of balance or coordination, dizziness, severe headache, change in vision Sudden or severe stomach pain, nausea, vomiting, fever, or bloody diarrhea Vision loss Side effects that usually do not require medical attention (report to your care team if they continue or are bothersome): Dizziness General discomfort or fatigue This list may not describe all possible side effects. Call your doctor for medical advice about side effects. You may report side effects to  FDA at 1-800-FDA-1088. Where should I keep my medication? Keep out of the reach of children and pets. Store at room temperature between 15 and 30 degrees C (59 and 86 degrees F). Protect from light and moisture. Throw away any unused medication after the expiration date. NOTE: This sheet is a summary. It may not cover all possible information. If you have questions about this medicine, talk to your doctor, pharmacist, or health care provider.  2023 Elsevier/Gold Standard (2020-09-06 00:00:00) Ondansetron Dissolving Tablets What is this medication? ONDANSETRON (on DAN se tron) prevents nausea and vomiting from chemotherapy, radiation, or surgery. It works by blocking substances in the body that may cause nausea or vomiting. It belongs to a group of medications called antiemetics. This medicine may be used for other purposes; ask your health care provider or pharmacist if you have questions. COMMON BRAND NAME(S): Zofran ODT What should I tell my care team before I take this medication? They need to know if you have any of these conditions: Heart disease History of irregular heartbeat Liver disease Low levels of magnesium or potassium in the blood An unusual or allergic reaction to ondansetron, granisetron, other medications, foods, dyes, or preservatives Pregnant or trying to get pregnant Breast-feeding How should I use this medication? These tablets are made to dissolve in the mouth. Do not try to push the tablet through the foil backing. With dry hands, peel away the foil backing and gently remove the tablet. Place the tablet in the mouth and allow it to dissolve, then swallow. While  you may take these tablets with water, it is not necessary to do so. Talk to your care team regarding the use of this medication in children. Special care may be needed. Overdosage: If you think you have taken too much of this medicine contact a poison control center or emergency room at once. NOTE: This  medicine is only for you. Do not share this medicine with others. What if I miss a dose? If you miss a dose, take it as soon as you can. If it is almost time for your next dose, take only that dose. Do not take double or extra doses. What may interact with this medication? Do not take this medication with any of the following: Apomorphine Certain medications for fungal infections like fluconazole, itraconazole, ketoconazole, posaconazole, voriconazole Cisapride Dronedarone Pimozide Thioridazine This medication may also interact with the following: Carbamazepine Certain medications for depression, anxiety, or psychotic disturbances Fentanyl Linezolid MAOIs like Carbex, Eldepryl, Marplan, Nardil, and Parnate Methylene blue (injected into a vein) Other medications that prolong the QT interval (cause an abnormal heart rhythm) like dofetilide, ziprasidone Phenytoin Rifampicin Tramadol This list may not describe all possible interactions. Give your health care provider a list of all the medicines, herbs, non-prescription drugs, or dietary supplements you use. Also tell them if you smoke, drink alcohol, or use illegal drugs. Some items may interact with your medicine. What should I watch for while using this medication? Check with your care team as soon as you can if you have any sign of an allergic reaction. What side effects may I notice from receiving this medication? Side effects that you should report to your care team as soon as possible: Allergic reactions--skin rash, itching, hives, swelling of the face, lips, tongue, or throat Bowel blockage--stomach cramping, unable to have a bowel movement or pass gas, loss of appetite, vomiting Chest pain (angina)--pain, pressure, or tightness in the chest, neck, back, or arms Heart rhythm changes--fast or irregular heartbeat, dizziness, feeling faint or lightheaded, chest pain, trouble breathing Irritability, confusion, fast or irregular  heartbeat, muscle stiffness, twitching muscles, sweating, high fever, seizure, chills, vomiting, diarrhea, which may be signs of serotonin syndrome Side effects that usually do not require medical attention (report to your care team if they continue or are bothersome): Constipation Diarrhea General discomfort and fatigue Headache This list may not describe all possible side effects. Call your doctor for medical advice about side effects. You may report side effects to FDA at 1-800-FDA-1088. Where should I keep my medication? Keep out of the reach of children and pets. Store between 2 and 30 degrees C (36 and 86 degrees F). Throw away any unused medication after the expiration date. NOTE: This sheet is a summary. It may not cover all possible information. If you have questions about this medicine, talk to your doctor, pharmacist, or health care provider.  2023 Elsevier/Gold Standard (2020-09-01 00:00:00) Cyclobenzaprine Tablets What is this medication? CYCLOBENZAPRINE (sye kloe BEN za preen) treats muscle spasms. It works by relaxing your muscles, which reduces muscle stiffness. It belongs to a group of medications called muscle relaxants. This medicine may be used for other purposes; ask your health care provider or pharmacist if you have questions. COMMON BRAND NAME(S): Fexmid, Flexeril What should I tell my care team before I take this medication? They need to know if you have any of these conditions: Heart disease, irregular heartbeat, or previous heart attack Liver disease Thyroid problem An unusual or allergic reaction to cyclobenzaprine, tricyclic antidepressants, lactose, other  medications, foods, dyes, or preservatives Pregnant or trying to get pregnant Breast-feeding How should I use this medication? Take this medication by mouth with a glass of water. Follow the directions on the prescription label. If this medication upsets your stomach, take it with food or milk. Take your  medication at regular intervals. Do not take it more often than directed. Talk to your care team about the use of this medication in children. Special care may be needed. Overdosage: If you think you have taken too much of this medicine contact a poison control center or emergency room at once. NOTE: This medicine is only for you. Do not share this medicine with others. What if I miss a dose? If you miss a dose, take it as soon as you can. If it is almost time for your next dose, take only that dose. Do not take double or extra doses. What may interact with this medication? Do not take this medication with any of the following: MAOIs like Carbex, Eldepryl, Marplan, Nardil, and Parnate Narcotic medications for cough Safinamide This medication may also interact with the following: Alcohol Bupropion Antihistamines for allergy, cough and cold Certain medications for anxiety or sleep Certain medications for bladder problems like oxybutynin, tolterodine Certain medications for depression like amitriptyline, fluoxetine, sertraline Certain medications for Parkinson's disease like benztropine, trihexyphenidyl Certain medications for seizures like phenobarbital, primidone Certain medications for stomach problems like dicyclomine, hyoscyamine Certain medications for travel sickness like scopolamine General anesthetics like halothane, isoflurane, methoxyflurane, propofol Ipratropium Local anesthetics like lidocaine, pramoxine, tetracaine Medications that relax muscles for surgery Narcotic medications for pain Phenothiazines like chlorpromazine, mesoridazine, prochlorperazine, thioridazine Verapamil This list may not describe all possible interactions. Give your health care provider a list of all the medicines, herbs, non-prescription drugs, or dietary supplements you use. Also tell them if you smoke, drink alcohol, or use illegal drugs. Some items may interact with your medicine. What should I  watch for while using this medication? Tell your care team if your symptoms do not start to get better or if they get worse. You may get drowsy or dizzy. Do not drive, use machinery, or do anything that needs mental alertness until you know how this medication affects you. Do not stand or sit up quickly, especially if you are an older patient. This reduces the risk of dizzy or fainting spells. Alcohol may interfere with the effect of this medication. Avoid alcoholic drinks. If you are taking another medication that also causes drowsiness, you may have more side effects. Give your care team a list of all medications you use. Your care team will tell you how much medication to take. Do not take more medication than directed. Call emergency for help if you have problems breathing or unusual sleepiness. Your mouth may get dry. Chewing sugarless gum or sucking hard candy, and drinking plenty of water may help. Contact your care team if the problem does not go away or is severe. What side effects may I notice from receiving this medication? Side effects that you should report to your care team as soon as possible: Allergic reactions--skin rash, itching, hives, swelling of the face, lips, tongue, or throat CNS depression--slow or shallow breathing, shortness of breath, feeling faint, dizziness, confusion, trouble staying awake Heart rhythm changes--fast or irregular heartbeat, dizziness, feeling faint or lightheaded, chest pain, trouble breathing Side effects that usually do not require medical attention (report to your care team if they continue or are bothersome): Constipation Dizziness Drowsiness Dry mouth Fatigue  Nausea This list may not describe all possible side effects. Call your doctor for medical advice about side effects. You may report side effects to FDA at 1-800-FDA-1088. Where should I keep my medication? Keep out of the reach of children. Store at room temperature between 15 and 30  degrees C (59 and 86 degrees F). Keep container tightly closed. Throw away any unused medication after the expiration date. NOTE: This sheet is a summary. It may not cover all possible information. If you have questions about this medicine, talk to your doctor, pharmacist, or health care provider.  2023 Elsevier/Gold Standard (2020-11-09 00:00:00)

## 2022-01-31 NOTE — Progress Notes (Unsigned)
[facility]    Provider:  Dr Lucia Gaskins Requesting Provider: Marita Kansas, PA-C Primary Care Provider:  [place], [facility]  CC:  migraines  HPI:  Diane Mclaughlin is a 26 y.o. female here as requested by Marita Kansas, PA-C for migraines. She has a PMHx of morbid obesity and constipation.  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  CT obtained with no obvious ventricular dilatation or intracranial mass.  She was sent for follow-up with neurology.  Symptoms well controlled after headache cocktail.  The beginning of May she was at the grocery store and when she looked up she saw spots in the right eye, then followed by a headache and the next day had a migraine, a week or 2 later she had "dancing lines" in the right eye, she went to the urgent care, she went to the eye doctor and there was no optic nerve edema, the eye dctor said exam was normal, they start with tension in the neck and radiate to the front bilateral, pulsating/throbbing, light/sound sensitivity, a quiet dark room helps, alleve would help in the past. Starting having headaches as a teenager, undiagnosed until the ED recently, she also has associated nausea. The headache would start 30-60 minutes after nausea or visual symptoms, smells can trigger. She has 4-5 migraines in the last month. Using computers can make it worse. She only has her perioed every three onths and she notices she may get them more around her period.     Reviewed notes, labs and imaging from outside physicians, which showed ***  5/12023: cbc/cmp/ nml review on her portal   From a thorough review of records, medications tried that can be used in  migraine management include Celebrex, Benadryl, gabapentin, ibuprofen, Toradol injections, Robaxin, Reglan, metoprolol, naproxen, ondansetron, Compazine,  12/29/2021: CT head showed No acute intracranial abnormalities including mass lesion or mass effect, hydrocephalus, extra-axial fluid collection, midline shift, hemorrhage, or acute infarction, large ischemic events (personally reviewed images)    Review of Systems: Patient complains of symptoms per HPI as well as the following symptoms ***. Pertinent negatives and positives per HPI. All others negative.   Social History   Socioeconomic History   Marital status: Single    Spouse name: Not on file   Number of children: Not on file   Years of education: Not on file   Highest education level: Not on file  Occupational History   Not on file  Tobacco Use   Smoking status: Never   Smokeless tobacco: Never  Substance and Sexual Activity   Alcohol use: Yes   Drug use: No   Sexual activity: Not on file  Other Topics Concern   Not on file  Social History Narrative   Not on file   Social Determinants of Health   Financial Resource Strain: Not on file  Food Insecurity: Not on file  Transportation Needs: Not on file  Physical Activity: Not on file  Stress: Not on file  Social Connections: Not on file  Intimate Partner Violence: Not on file    No family history on file.  Past Medical History:  Diagnosis Date   Abscess    aspiration of appendix abscess   Constipation, chronic  11/07/2016   Pneumonia age 76   Seasonal allergies 11/07/2016    Patient Active Problem List   Diagnosis Date Noted   Chronic appendicitis s/p lap appendectomy 12/27/2016 12/27/2016   Acute appendicitis with perforation and peritoneal abscess 11/07/2016   Morbid obesity with body mass index of 40.0-49.9 (HCC) 11/07/2016   Constipation, chronic 11/07/2016   Seasonal allergies 11/07/2016    Past Surgical History:  Procedure Laterality Date    LAPAROSCOPIC APPENDECTOMY N/A 12/27/2016   Procedure: APPENDECTOMY LAPAROSCOPIC WITH DIAGNOSTIC LAPAROSCOPY;  Surgeon: Karie Soda, MD;  Location: [facility];  Service: General;  Laterality: N/A;    Current Outpatient Medications  Medication Sig Dispense Refill   fluticasone (FLONASE) 50 MCG/ACT nasal spray Place 1 spray into both nostrils daily as needed for allergies or rhinitis.     naproxen (NAPROSYN) 500 MG tablet Take 1 tablet (500 mg total) by mouth every 12 (twelve) hours as needed for mild pain or moderate pain. 30 tablet 1   polyethylene glycol (MIRALAX / GLYCOLAX) packet Take 17 g by mouth daily as needed.     traMADol (ULTRAM) 50 MG tablet Take 1-2 tablets (50-100 mg total) by mouth every 6 (six) hours as needed for moderate pain or severe pain. 30 tablet 0   No current facility-administered medications for this visit.    Allergies as of 01/31/2022 - Review Complete 12/29/2021  Allergen Reaction Noted   Lactose intolerance (gi) Nausea Only 11/07/2016    Vitals: There were no vitals taken for this visit. Last Weight:  Wt Readings from Last 1 Encounters:  12/27/16 232 lb (105.2 kg)   Last Height:   Ht Readings from Last 1 Encounters:  12/27/16 5\' 7"  (1.702 m)     Physical exam: Exam: Gen: NAD, conversant, well nourised, obese, well groomed                     CV: RRR, no MRG. No Carotid Bruits. No peripheral edema, warm, nontender Eyes: Conjunctivae clear without exudates or hemorrhage  Neuro: Detailed Neurologic Exam  Speech:    Speech is normal; fluent and spontaneous with normal comprehension.  Cognition:    The patient is oriented to person, place, and time;     recent and remote memory intact;     language fluent;     normal attention, concentration,     fund of knowledge Cranial Nerves:    The pupils are equal, round, and reactive to light. The fundi are normal and spontaneous venous pulsations are present. Visual fields are full to finger confrontation.  Extraocular movements are intact. Trigeminal sensation is intact and the muscles of mastication are normal. The face is symmetric. The palate elevates in the midline. Hearing intact. Voice is normal. Shoulder shrug is normal. The tongue has normal motion without fasciculations.   Coordination:    Normal finger to nose and heel to shin. Normal rapid alternating movements.   Gait:    Heel-toe and tandem gait are normal.   Motor Observation:    No asymmetry, no atrophy, and no involuntary movements noted. Tone:    Normal muscle tone.    Posture:    Posture is normal. normal erect    Strength:    Strength is V/V in the upper and lower limbs.      Sensation: intact to LT     Reflex Exam:  DTR's:    Deep tendon reflexes in the upper and lower extremities are normal bilaterally.   Toes:  The toes are downgoing bilaterally.   Clonus:    Clonus is absent.    Assessment/Plan:    No orders of the defined types were placed in this encounter.  No orders of the defined types were placed in this encounter.   Cc: Dondra Spry,  Pa, [facility]  Naomie Dean, MD  [facility] [address] [place], [place] [address]  Phone [phone] Fax [phone]

## 2022-02-01 LAB — TSH: TSH: 2.26 u[IU]/mL (ref 0.450–4.500)

## 2022-02-01 LAB — T4, FREE: Free T4: 1.1 ng/dL (ref 0.82–1.77)

## 2022-02-03 ENCOUNTER — Encounter: Payer: Self-pay | Admitting: Neurology

## 2022-02-03 DIAGNOSIS — G43709 Chronic migraine without aura, not intractable, without status migrainosus: Secondary | ICD-10-CM | POA: Insufficient documentation

## 2022-02-03 DIAGNOSIS — G43109 Migraine with aura, not intractable, without status migrainosus: Secondary | ICD-10-CM | POA: Insufficient documentation

## 2022-02-20 ENCOUNTER — Encounter: Payer: Self-pay | Admitting: Neurology

## 2022-02-21 NOTE — Telephone Encounter (Signed)
See email attached.  She is asking for preventatiive and your last note does state trying preventative topiramate.Marland Kitchen

## 2022-03-04 ENCOUNTER — Other Ambulatory Visit: Payer: Self-pay | Admitting: Neurology

## 2022-03-04 MED ORDER — TOPIRAMATE 50 MG PO TABS: ORAL_TABLET | ORAL | 6 refills | Status: DC

## 2022-05-06 ENCOUNTER — Other Ambulatory Visit: Payer: Self-pay | Admitting: Neurology

## 2022-05-06 ENCOUNTER — Telehealth (INDEPENDENT_AMBULATORY_CARE_PROVIDER_SITE_OTHER): Payer: BC Managed Care – PPO | Admitting: Neurology

## 2022-05-06 ENCOUNTER — Telehealth: Payer: Self-pay | Admitting: Neurology

## 2022-05-06 DIAGNOSIS — G43009 Migraine without aura, not intractable, without status migrainosus: Secondary | ICD-10-CM

## 2022-05-06 MED ORDER — ONDANSETRON 4 MG PO TBDP: 4.0000 mg | ORAL_TABLET | Freq: Three times a day (TID) | ORAL | 4 refills | Status: DC | PRN

## 2022-05-06 MED ORDER — TOPIRAMATE 100 MG PO TABS: 100.0000 mg | ORAL_TABLET | Freq: Every evening | ORAL | 4 refills | Status: DC

## 2022-05-06 MED ORDER — RIZATRIPTAN BENZOATE 10 MG PO TBDP
10.0000 mg | ORAL_TABLET | ORAL | 4 refills | Status: DC | PRN
Start: 2022-05-06 — End: 2023-07-16

## 2022-05-06 NOTE — Progress Notes (Unsigned)
[facility]    Provider:  Dr Lucia Gaskins Requesting Provider: Pa, [facility]* Primary Care Provider:  Walker, [employer]  CC:  migraines  Virtual Visit via Video Note  I connected with Diane Mclaughlin on 05/06/2022: at  2:30 PM EDT by a video enabled telemedicine application and verified that I am speaking with the correct person using two identifiers.  Location: Patient: home Provider: office   I discussed the limitations of evaluation and management by telemedicine and the availability of in person appointments. The patient expressed understanding and agreed to proceed.  Follow Up Instructions:    I discussed the assessment and treatment plan with the patient. The patient was provided an opportunity to ask questions and all were answered. The patient agreed with the plan and demonstrated an understanding of the instructions.   The patient was advised to call back or seek an in-person evaluation if the symptoms worsen or if the condition fails to improve as anticipated.  I provided 20 minutes of non-face-to-face time during this encounter.   Anson Fret, MD   05/06/2022: Topamax working well on 100mg , she is getting maybe 3-4 migraines since starting medication it has been great. Also sleep is a trigger. She can take alleve or rizatriptan acutely right away. The migraines she has had were much milder without aura and taking magnesium which also helps with aura.  She gets tingling and changes in taste but doesn't bother her.   HPI:  Diane Mclaughlin is a 26 y.o. female here as requested by Pa, [facility]* for migraines. She has a PMHx of morbid obesity and constipation.  I reviewed emergency room notes from last month, where she reported that she had had multiple episodes since Dec 16, 2021 with right-sided blurry vision followed by a pulsating headache, she has a family history of migraines in her father, denied eye pain and her presentation  was consistent with migraine with aura versus possible pseudotumor.  CT obtained with no obvious ventricular dilatation or intracranial mass.  She was sent for follow-up with neurology.  Symptoms well controlled after headache cocktail.  The beginning of May she was at the grocery store and when she looked up she saw spots in the right eye, then followed by a headache and the next day had a migraine, a week or 2 later she had "dancing lines" in the right eye, she went to the urgent care, she went to the eye doctor and there was no optic nerve edema, the eye dctor said exam was normal, they start with tension in the neck and radiate to the front bilateral, pulsating/throbbing, light/sound sensitivity, a quiet dark room helps, alleve would help in the past. Starting having headaches as a teenager, undiagnosed until the ED recently, she also has associated nausea. The headache would start 30-60 minutes after nausea or visual symptoms, smells can trigger. She has 4-5 migraines in the last month. Using computers can make it worse. She only has her perioed every three onths and she notices she may get them more around her period. No other focal neurologic deficits, associated symptoms, inciting events or modifiable factors.   Reviewed notes, labs and imaging from outside physicians, which showed:  5/12023: cbc/cmp/ nml review on her portal   From a thorough review of records, medications tried that can be used in migraine management include Celebrex, Benadryl, gabapentin, ibuprofen, Toradol injections, Robaxin, Reglan, metoprolol, naproxen, ondansetron, Compazine,amitriptyline, propranolol contraindicated due to hypotension, imitrex, rizatriptan  12/29/2021:  CT head showed No acute intracranial abnormalities including mass lesion or mass effect, hydrocephalus, extra-axial fluid collection, midline shift, hemorrhage, or acute infarction, large ischemic events (personally reviewed images)    Review of  Systems: Patient complains of symptoms per HPI as well as the following symptoms headaches. Pertinent negatives and positives per HPI. All others negative.   Social History   Socioeconomic History   Marital status: Single    Spouse name: Not on file   Number of children: Not on file   Years of education: Not on file   Highest education level: Not on file  Occupational History   Not on file  Tobacco Use   Smoking status: Never   Smokeless tobacco: Never  Vaping Use   Vaping Use: Never used  Substance and Sexual Activity   Alcohol use: Yes    Comment: occ   Drug use: No   Sexual activity: Not on file  Other Topics Concern   Not on file  Social History Narrative   C[employer]  only weekend.   Education:  Oceanographer   Social Determinants of Health   Financial Resource Strain: Not on file  Food Insecurity: Not on file  Transportation Needs: Not on file  Physical Activity: Not on file  Stress: Not on file  Social Connections: Not on file  Intimate Partner Violence: Not on file    Family History  Problem Relation Age of Onset   Migraines Father    Cancer Father    COPD Father     Past Medical History:  Diagnosis Date   Abscess    aspiration of appendix abscess   Constipation, chronic 11/07/2016   Pneumonia age 26   Seasonal allergies 11/07/2016    Patient Active Problem List   Diagnosis Date Noted   Chronic migraine without aura without status migrainosus, not intractable 02/03/2022   Migraine with aura and without status migrainosus, not intractable 02/03/2022   Chronic appendicitis s/p lap appendectomy 12/27/2016 12/27/2016   Acute appendicitis with perforation and peritoneal abscess 11/07/2016   Morbid obesity with body mass index of 40.0-49.9 (HCC) 11/07/2016   Constipation, chronic 11/07/2016   Seasonal allergies 11/07/2016    Past Surgical History:  Procedure Laterality Date   LAPAROSCOPIC APPENDECTOMY N/A 12/27/2016   Procedure:  APPENDECTOMY LAPAROSCOPIC WITH DIAGNOSTIC LAPAROSCOPY;  Surgeon: Karie Soda, MD;  Location: [facility];  Service: General;  Laterality: N/A;    Current Outpatient Medications  Medication Sig Dispense Refill   cyclobenzaprine (FLEXERIL) 10 MG tablet Take 1 tablet (10 mg total) by mouth at bedtime. 90 tablet 3   levocetirizine (XYZAL) 5 MG tablet Take 5 mg by mouth daily.     levonorgestrel-ethinyl estradiol (SEASONALE) 0.15-0.03 MG tablet Take 1 tablet by mouth daily.     naproxen sodium (ALEVE) 220 MG tablet Take 220 mg by mouth daily as needed.     ondansetron (ZOFRAN-ODT) 4 MG disintegrating tablet Take 1-2 tablets (4-8 mg total) by mouth every 8 (eight) hours as needed. 60 tablet 4   rizatriptan (MAXALT-MLT) 10 MG disintegrating tablet Take 1 tablet (10 mg total) by mouth as needed for migraine. May repeat in 2 hours if needed 27 tablet 4   topiramate (TOPAMAX) 100 MG tablet Take 1 tablet (100 mg total) by mouth at bedtime. Start with one tab at bedtime(50mg ). In 2 weeks can increase to 100mg  at bedtime (2 tabs) 90 tablet 4   No current facility-administered medications for this visit.    Allergies  as of 05/06/2022 - Review Complete 01/31/2022  Allergen Reaction Noted   Lactose intolerance (gi) Nausea Only 11/07/2016    Vitals: There were no vitals taken for this visit. Last Weight:  Wt Readings from Last 1 Encounters:  01/31/22 244 lb 12.8 oz (111 kg)   Last Height:   Ht Readings from Last 1 Encounters:  01/31/22 5\' 7"  (1.702 m)    Physical exam: Exam: Gen: NAD, conversant      CV:  Denies palpitations or chest pain or SOB. VS: Breathing at a normal rate.  Not febrile. Eyes: Conjunctivae clear without exudates or hemorrhage  Neuro: Detailed Neurologic Exam  Speech:    Speech is normal; fluent and spontaneous with normal comprehension.  Cognition:    The patient is oriented to person, place, and time;     recent and remote memory intact;     language fluent;      normal attention, concentration,     fund of knowledge Cranial Nerves:    The pupils are equal, round, and reactive to light. Cannot perform fundoscopic exam. Visual fields are full to finger confrontation. Extraocular movements are intact.  The face is symmetric with normal sensation. The palate elevates in the midline. Hearing intact. Voice is normal. Shoulder shrug is normal. The tongue has normal motion without fasciculations.   Coordination:    Normal finger to nose  Gait:    Normal native gait  Motor Observation:   no involuntary movements noted. Tone:    Appears normal  Posture:    Posture is normal. normal erect    Strength:    Strength is anti-gravity and symmetric in the upper and lower limbs.          Assessment/Plan:  Patient with episodic migraines with and without aura  05/06/2022: Topamax working well on 100mg , she is getting maybe 3-4 migraines since starting medication it has been great. Also sleep is a trigger. She can take alleve or rizatriptan acutely right away. The migraines she has had were much milder without aura and taking magnesium which also helps with aura.  She gets tingling and changes in taste but doesn't bother her.  Continue.  Meds ordered this encounter  Medications   topiramate (TOPAMAX) 100 MG tablet    Sig: Take 1 tablet (100 mg total) by mouth at bedtime. Start with one tab at bedtime(50mg ). In 2 weeks can increase to 100mg  at bedtime (2 tabs)    Dispense:  90 tablet    Refill:  4   rizatriptan (MAXALT-MLT) 10 MG disintegrating tablet    Sig: Take 1 tablet (10 mg total) by mouth as needed for migraine. May repeat in 2 hours if needed    Dispense:  27 tablet    Refill:  4    3 month supply   ondansetron (ZOFRAN-ODT) 4 MG disintegrating tablet    Sig: Take 1-2 tablets (4-8 mg total) by mouth every 8 (eight) hours as needed.    Dispense:  60 tablet    Refill:  4    3 month supply   Initial assessment and plan as below:  For light  Sensitivity: FL-41 filter for glasses(theraspects, somnilight) and blue light filters Emergent/acute management: RIGHT at the onset of migraine take Rizatriptan: Please take one tablet at the onset of your headache. If it does not improve the symptoms please take one additional tablet. Do not take more then 2 tablets in 24hrs. Do not take use more then 2 to 3 times in  a week. If you don;t like the triptans, would move to the new medications Ubrelvy or Nurtec [email or web address] - dry needling for neck spasms Flexeril at bedtime for neck spasms IF we decide on a preventative, would try Topiramate/Topamax and then move to the newer meds like Nurtec, qulipta.  Keep a journal of migraines Discussed weight loss, is a risk factor for progression to chronic migraines Discussed brain imaging, CT was negative, low threshold for MRI report any changes or progression of or new symptoms.   Discussed: There is increased risk for stroke in women with migraine with aura and a contraindication for the combined contraceptive pill for use by women who have migraine with aura. The risk for women with migraine without aura is lower. However other risk factors like smoking are far more likely to increase stroke risk than migraine. There is a recommendation for no smoking and for the use of OCPs without estrogen such as progestogen only pills particularly for women with migraine with aura.Marland Kitchen People who have migraine headaches with auras may be 3 times more likely to have a stroke caused by a blood clot, compared to migraine patients who don't see auras. Women who take hormone-replacement therapy may be 30 percent more likely to suffer a clot-based stroke than women not taking medication containing estrogen. Other risk factors like smoking and high blood pressure may be  much more important.  Discussed: To prevent or relieve headaches, try the following: Cool Compress. Lie down and place a cool compress on your head.  Avoid  headache triggers. If certain foods or odors seem to have triggered your migraines in the past, avoid them. A headache diary might help you identify triggers.  Include physical activity in your daily routine. Try a daily walk or other moderate aerobic exercise.  Manage stress. Find healthy ways to cope with the stressors, such as delegating tasks on your to-do list.  Practice relaxation techniques. Try deep breathing, yoga, massage and visualization.  Eat regularly. Eating regularly scheduled meals and maintaining a healthy diet might help prevent headaches. Also, drink plenty of fluids.  Follow a regular sleep schedule. Sleep deprivation might contribute to headaches Consider biofeedback. With this mind-body technique, you learn to control certain bodily functions -- such as muscle tension, heart rate and blood pressure -- to prevent headaches or reduce headache pain.    Proceed to emergency room if you experience new or worsening symptoms or symptoms do not resolve, if you have new neurologic symptoms or if headache is severe, or for any concerning symptom.   Provided education and documentation from American headache Society toolbox including articles on: chronic migraine medication overuse headache, chronic migraines, prevention of migraines, behavioral and other nonpharmacologic treatments for headache.   No orders of the defined types were placed in this encounter.  Meds ordered this encounter  Medications   topiramate (TOPAMAX) 100 MG tablet    Sig: Take 1 tablet (100 mg total) by mouth at bedtime. Start with one tab at bedtime(50mg ). In 2 weeks can increase to 100mg  at bedtime (2 tabs)    Dispense:  90 tablet    Refill:  4   rizatriptan (MAXALT-MLT) 10 MG disintegrating tablet    Sig: Take 1 tablet (10 mg total) by mouth as needed for migraine. May repeat in 2 hours if needed    Dispense:  27 tablet    Refill:  4    3 month supply   ondansetron (ZOFRAN-ODT) 4 MG disintegrating  tablet  Sig: Take 1-2 tablets (4-8 mg total) by mouth every 8 (eight) hours as needed.    Dispense:  60 tablet    Refill:  4    3 month supply    Cc: Pa, [facility]  Pa, [facility]  Naomie DeanAntonia Tylar Merendino, MD  [facility] [address] [place], [place] [id number]  Phone [phone] Fax [phone]

## 2022-05-06 NOTE — Telephone Encounter (Signed)
Scheduled f/u with Butler Denmark for 05/08/23 at 2:15 pm.

## 2022-05-06 NOTE — Telephone Encounter (Signed)
Please call and schedule a video med refill appointment one year with NP she is doing great

## 2022-05-07 ENCOUNTER — Encounter: Payer: Self-pay | Admitting: Neurology

## 2022-10-23 ENCOUNTER — Other Ambulatory Visit: Payer: Self-pay | Admitting: Neurology

## 2023-01-30 ENCOUNTER — Other Ambulatory Visit: Payer: Self-pay | Admitting: Neurology

## 2023-03-25 ENCOUNTER — Telehealth: Payer: Self-pay | Admitting: Neurology

## 2023-03-25 ENCOUNTER — Encounter: Payer: Self-pay | Admitting: Neurology

## 2023-03-25 NOTE — Telephone Encounter (Signed)
LVM and sent letter in mail informing pt of need to reschedule 05/08/23 appt - NP out

## 2023-05-08 ENCOUNTER — Ambulatory Visit: Payer: BC Managed Care – PPO | Admitting: Neurology

## 2023-06-17 ENCOUNTER — Other Ambulatory Visit: Payer: Self-pay | Admitting: Neurology

## 2023-06-26 ENCOUNTER — Telehealth: Payer: Self-pay | Admitting: Neurology

## 2023-06-26 NOTE — Telephone Encounter (Signed)
Pt rescheduled appt due to had cancelled due to provider out. 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 Rescheduled appt for 12/4//24 at 3:15 pm change to a MyChart Video Visit.

## 2023-07-15 NOTE — Progress Notes (Unsigned)
   Virtual Visit via Video Note  I connected with Diane Mclaughlin on 07/15/23 at  3:15 PM EST by a video enabled telemedicine application and verified that I am speaking with the correct person using two identifiers.  Location: Patient: at her work Provider: in the office    I discussed the limitations of evaluation and management by telemedicine and the availability of in person appointments. The patient expressed understanding and agreed to proceed.  History of Present Illness: Today July 16, 2023 SS: Via VV. Has been off Topamax for several weeks because her rx ran out. Realizes it was giving her awful acid reflux. Wants to switch to something else. Just had eye visit, was told normal, no papilledema. Hard to determine migraine frequency, 2 migraines, recently had migraine, lingering for few days. Has aura with spots in vision when look in light, can have dancing squiggly lines. Is not planning pregnancy, is on birth control. Takes magnesium 500 mg daily for migraine prevention. Has Maxalt as needed, Aleve.   Dr. Lucia Gaskins 05/06/2022: Topamax working well on 100mg , she is getting maybe 3-4 migraines since starting medication it has been great. Also sleep is a trigger. She can take alleve or rizatriptan acutely right away. The migraines she has had were much milder without aura and taking magnesium which also helps with aura.  She gets tingling and changes in taste but doesn't bother her.   Observations/Objective: Via video visit, is alert and oriented, speech is clear and concise, facial symmetry noted, moves about freely  Assessment and Plan: 1.  Episodic migraines with and without aura  -Start Nurtec 75 mg tablet every other day for migraine prevention -Continue Maxalt as needed for acute headache, may combine with Aleve, Zofran -Previously tried and failed: Topamax, propranolol contraindicated due to low blood pressure (104/84 last PCP visit), Zoloft -Saw her eye doctor recently and was  told everything was normal.  Follow Up Instructions: 6 months   I discussed the assessment and treatment plan with the patient. The patient was provided an opportunity to ask questions and all were answered. The patient agreed with the plan and demonstrated an understanding of the instructions.   The patient was advised to call back or seek an in-person evaluation if the symptoms worsen or if the condition fails to improve as anticipated.  Otila Kluver, DNP  [facility] [address] [place], [place] [address] [phone]

## 2023-07-16 ENCOUNTER — Telehealth: Payer: BC Managed Care – PPO | Admitting: Neurology

## 2023-07-16 DIAGNOSIS — G43009 Migraine without aura, not intractable, without status migrainosus: Secondary | ICD-10-CM

## 2023-07-16 DIAGNOSIS — G43709 Chronic migraine without aura, not intractable, without status migrainosus: Secondary | ICD-10-CM

## 2023-07-16 MED ORDER — RIZATRIPTAN BENZOATE 10 MG PO TBDP: 10.0000 mg | ORAL_TABLET | ORAL | 4 refills | Status: AC | PRN

## 2023-07-16 MED ORDER — NURTEC 75 MG PO TBDP: 75.0000 mg | ORAL_TABLET | ORAL | 11 refills | Status: DC

## 2023-07-16 MED ORDER — ONDANSETRON 4 MG PO TBDP: 4.0000 mg | ORAL_TABLET | Freq: Three times a day (TID) | ORAL | 3 refills | Status: AC | PRN

## 2023-07-16 NOTE — Patient Instructions (Signed)
We will start Nurtec 75 mg tablet every other day for migraine prevention.  Can continue Maxalt as needed for acute headache, may combine with Zofran early.

## 2023-10-12 IMAGING — CT CT HEAD W/O CM
3 series · 16 of 47 positions shown, 19 images · non-contrast
Comparison: None Available.

CLINICAL DATA: Headache, new or worsening (Age >= 50y)



[Series 2: head wo · axial · 0.47mm/px · z∈[+1320,+1460]mm · 10 of 34 slices shown, 13 images]
[im 3/34  brain]
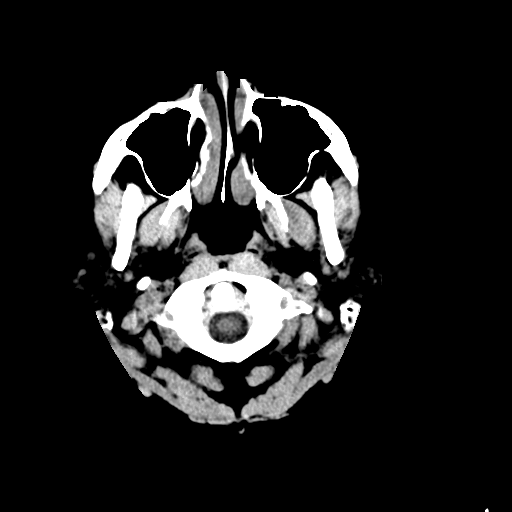
[im 3/34  bone]
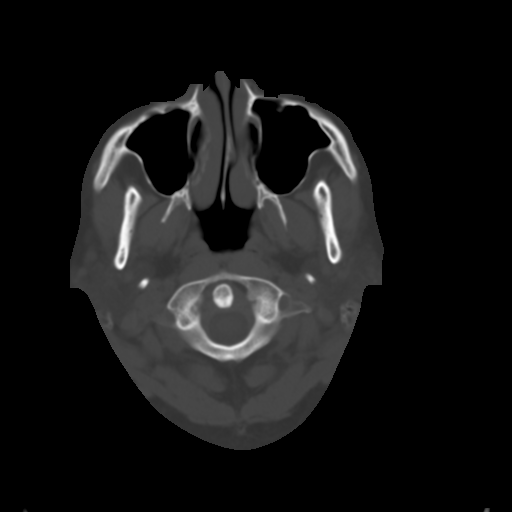
[im 6/34  brain]
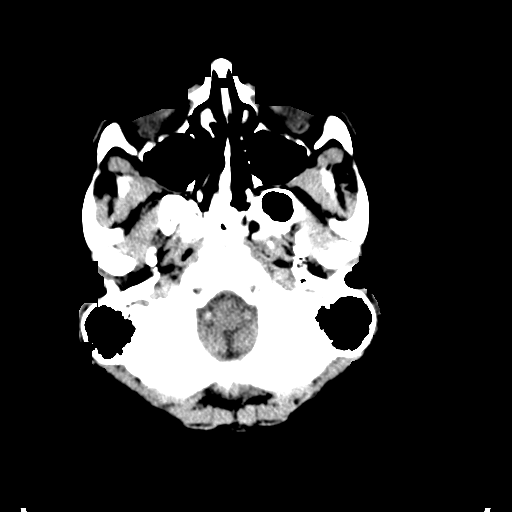
[im 10/34  brain]
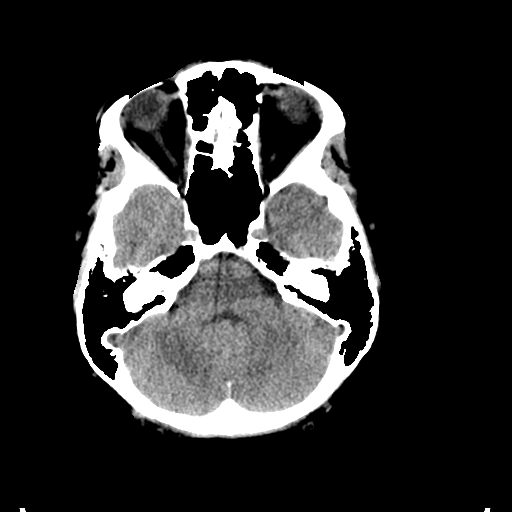
[im 12/34  brain]
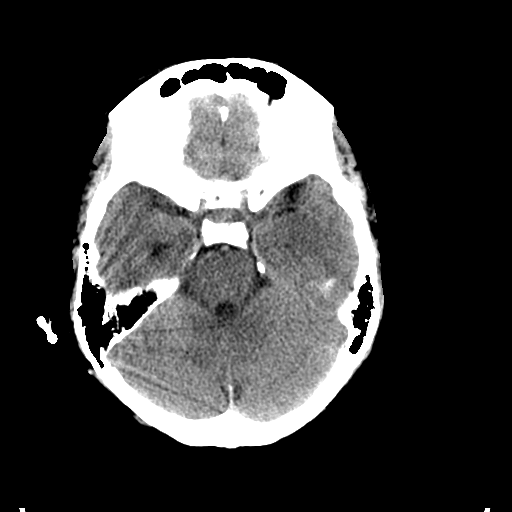
[im 15/34  brain]
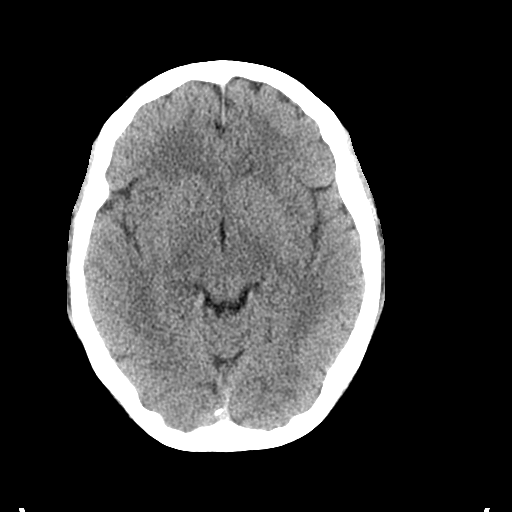
[im 15/34  bone]
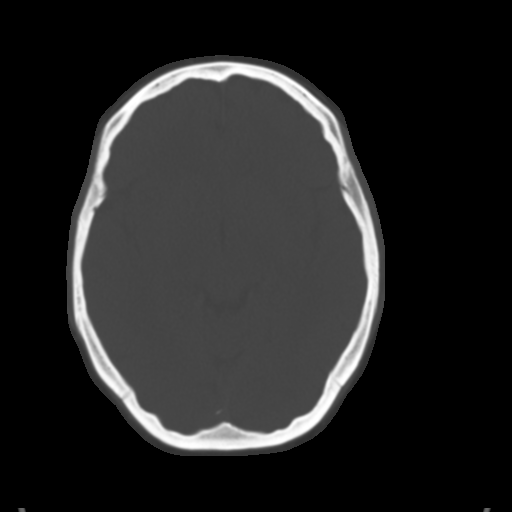
[im 19/34  brain]
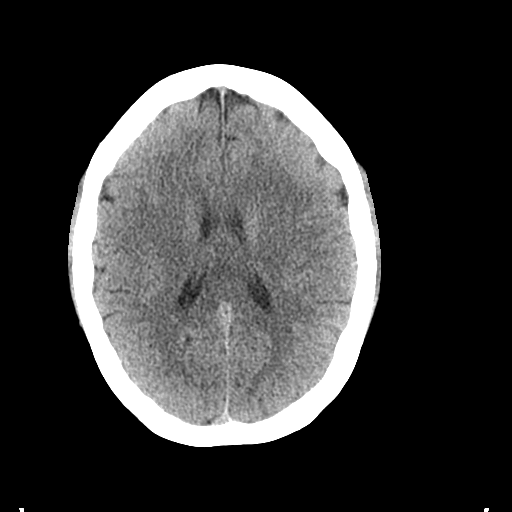
[im 22/34  brain]
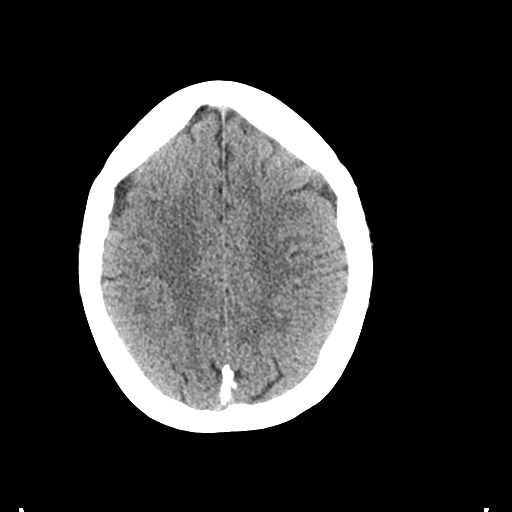
[im 26/34  brain]
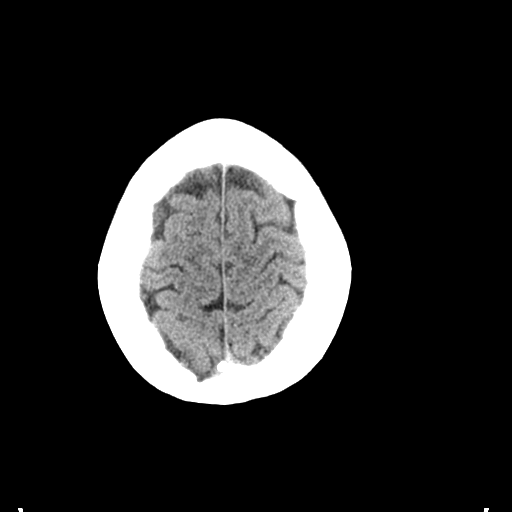
[im 28/34  brain]
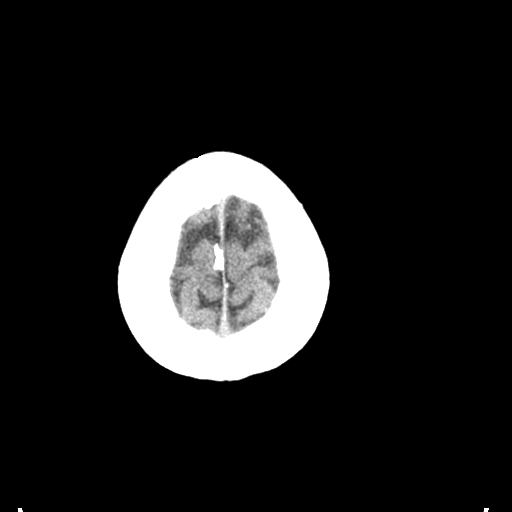
[im 28/34  bone]
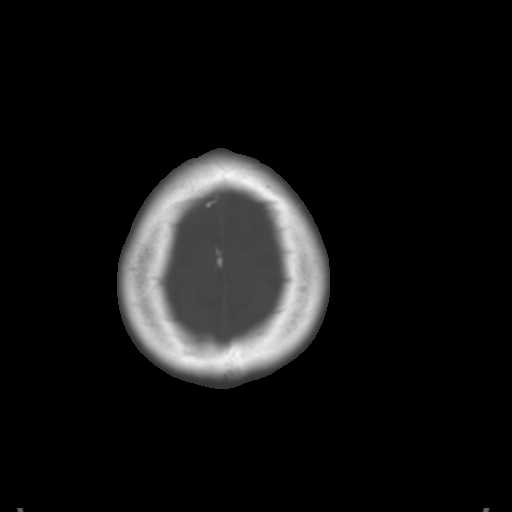
[im 31/34  brain]
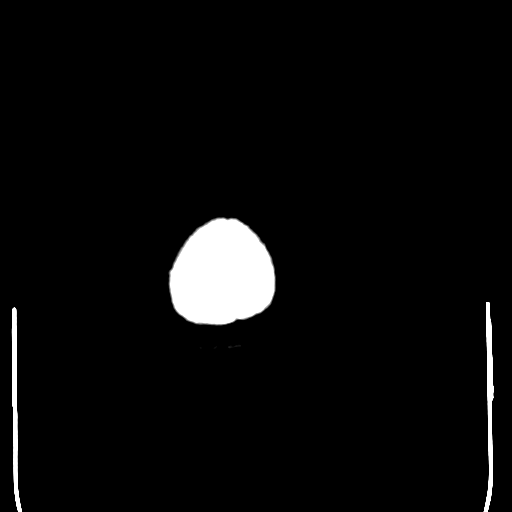

[Series 4: coronal soft tissue · coronal · 0.36mm/px · 3 of 69 slices shown]
[im 23/69  brain]
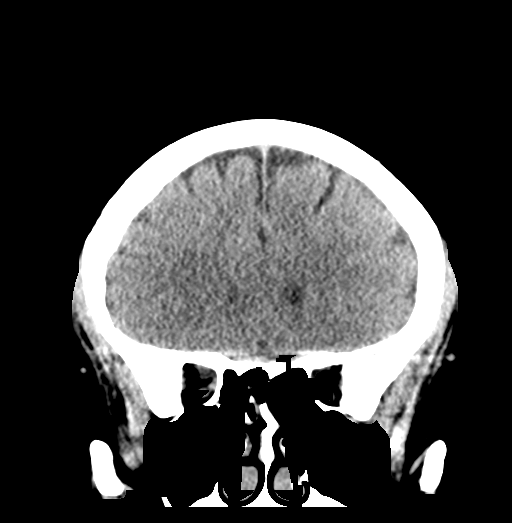
[im 31/69  brain]
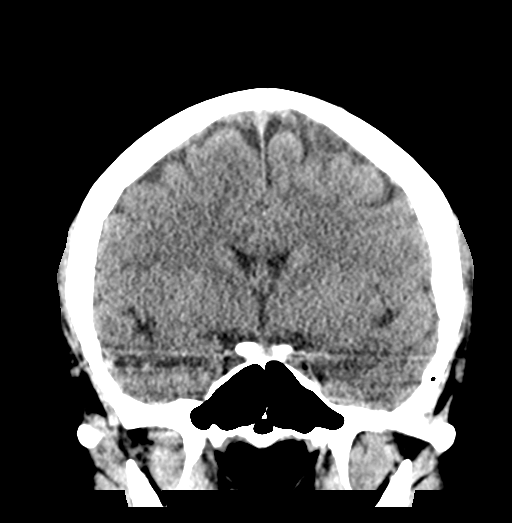
[im 38/69  brain]
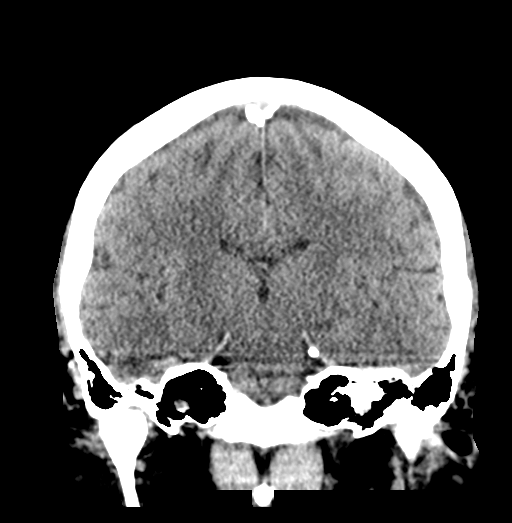

[Series 5: sagittal soft tissue · sagittal · 0.36mm/px · 3 of 61 slices shown]
[im 21/61  brain]
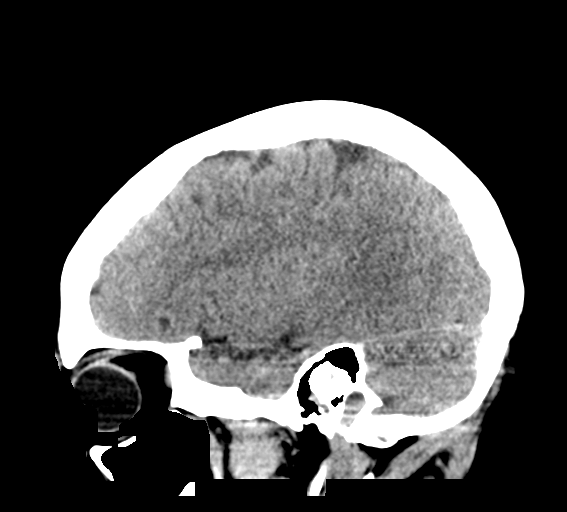
[im 31/61  brain]
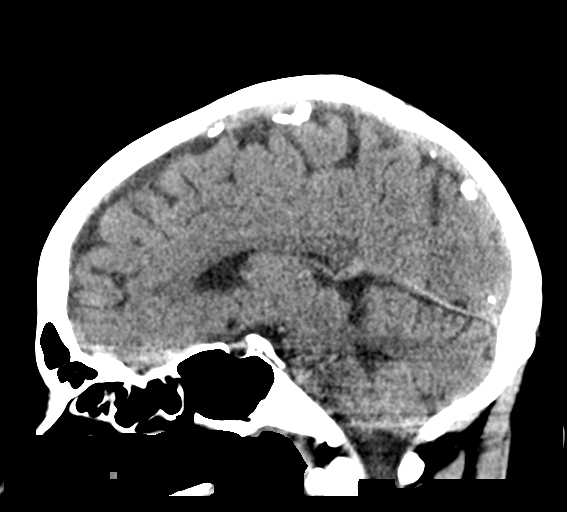
[im 41/61  brain]
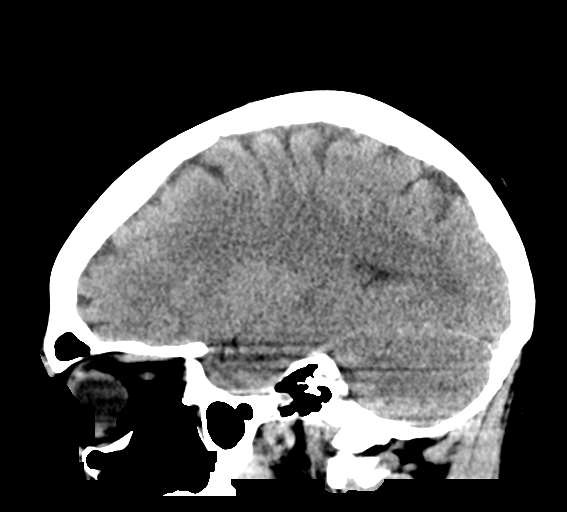

[16 of 47 positions shown; findings below may reference images not displayed]

FINDINGS: Brain: Normal anatomic configuration. No abnormal intra or
extra-axial mass lesion or fluid collection. No abnormal mass effect
or midline shift. No evidence of acute intracranial hemorrhage or
infarct. Ventricular size is normal. Cerebellum unremarkable.

Vascular: Unremarkable

Skull: Intact

Sinuses/Orbits: Paranasal sinuses are clear. Orbits are
unremarkable.

Other: Mastoid air cells and middle ear cavities are clear.
IMPRESSION: No acute intracranial abnormality.

## 2024-02-16 NOTE — Progress Notes (Unsigned)
 Patient: Diane Mclaughlin Date of Birth: [birth year]  Reason for Visit: Follow up History from: Patient Primary Neurologist: Ines   ASSESSMENT AND PLAN 28 y.o. year old female   1.  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 The risk for women with migraine without aura is lower. However other risk factors like smoking are far more likely to increase stroke risk than migraine. There is a recommendation for no smoking and for the use of OCPs without estrogen such as progestogen only pills particularly for women with migraine with aura.SABRA People who have migraine headaches with auras may be 3 times more likely to have a stroke caused by a blood clot, compared to migraine patients who don't see auras. Women who take hormone-replacement therapy may be 30 percent more likely to suffer a clot-based stroke than women not taking medication containing estrogen. Other risk factors like smoking and high blood pressure may be  much more important. And stroke is still a rare complication due to migraine aura and is controversial and lower doses may not cause a risk.  HISTORY  Update 02/17/24 SS: Last visit, we started Nurtec for migraine prevention. Doing well. Sometimes can feel migraine coming on when it is the day to take the Nurtec. Has not had any migraine with aura. She takes OTC magnesium for migraines. Since starting magnesium, can no aura with migraine. Has not  taken the Maxalt , Aleve  works well for her.   Update July 16, 2023 SS: Via [facility]. Has been off Topamax  for several weeks because her rx ran out. Realizes it was giving her awful acid reflux. Wants to switch to something else. Just had eye visit, was told normal, no papilledema. Hard to determine migraine frequency, 2 migraines, recently had migraine, lingering for few days. Has aura with spots in vision when look in light, can have dancing squiggly lines. Is not planning pregnancy, is on birth control. Takes magnesium 500 mg daily for migraine prevention. Has Maxalt  as needed, Aleve .    Dr. Ines 05/06/2022: Topamax  working well on 100mg , she is getting maybe 3-4 migraines since starting medication it has been great. Also sleep is a trigger. She can take alleve or rizatriptan  acutely right away. The migraines she has had were much milder without aura and taking magnesium which also helps with aura.  She gets tingling and changes in taste but doesn't bother her.   REVIEW OF SYSTEMS: Out of a complete 14 system review of symptoms, the patient complains only of the following symptoms, and all other reviewed systems are negative.  See HPI  ALLERGIES: Allergies  Allergen Reactions   Lactose Intolerance (Gi) Nausea Only   Topiramate  Nausea Only    HOME MEDICATIONS: Outpatient Medications Prior to Visit  Medication Sig Dispense Refill   cyclobenzaprine  (FLEXERIL ) 10 MG tablet TAKE 1 TABLET BY MOUTH EVERYDAY AT BEDTIME 90 tablet 6   levocetirizine (XYZAL) 5 MG tablet Take 5 mg by mouth daily.  levonorgestrel-ethinyl estradiol (SEASONALE) 0.15-0.03 MG tablet Take 1 tablet by mouth daily.     naproxen  sodium (ALEVE ) 220 MG tablet Take 220 mg by mouth daily as needed.     ondansetron  (ZOFRAN -ODT) 4 MG disintegrating tablet Take 1-2 tablets (4-8 mg total) by mouth every 8 (eight) hours as needed. 20 tablet 3   rizatriptan  (MAXALT -MLT) 10 MG disintegrating tablet Take 1 tablet (10 mg total) by mouth as  needed for migraine. May repeat in 2 hours if needed 27 tablet 4   sertraline (ZOLOFT) 50 MG tablet Take 50 mg by mouth daily.     Rimegepant Sulfate (NURTEC) 75 MG TBDP Take 1 tablet (75 mg total) by mouth every other day. 16 tablet 11   No facility-administered medications prior to visit.    PAST MEDICAL HISTORY: Past Medical History:  Diagnosis Date   Abscess    aspiration of appendix abscess   Constipation, chronic 11/07/2016   Pneumonia age 14   Seasonal allergies 11/07/2016    PAST SURGICAL HISTORY: Past Surgical History:  Procedure Laterality Date   LAPAROSCOPIC APPENDECTOMY N/A 12/27/2016   Procedure: APPENDECTOMY LAPAROSCOPIC WITH DIAGNOSTIC LAPAROSCOPY;  Surgeon: Sheldon Standing, MD;  Location: [facility];  Service: General;  Laterality: N/A;    FAMILY HISTORY: Family History  Problem Relation Age of Onset   Migraines Father    Cancer Father    COPD Father     SOCIAL HISTORY: Social History   Socioeconomic History   Marital status: Single    Spouse name: Not on file   Number of children: Not on file   Years of education: Not on file   Highest education level: Not on file  Occupational History   Not on file  Tobacco Use   Smoking status: Never   Smokeless tobacco: Never  Vaping Use   Vaping status: Never Used  Substance and Sexual Activity   Alcohol use: Yes    Comment: occ   Drug use: No   Sexual activity: Not on file  Other Topics Concern   Not on file  Social History Narrative   [place]  only weekend.   Education:  Oceanographer   Social Drivers of Corporate investment banker Strain: Not on file  Food Insecurity: Not on file  Transportation Needs: Not on file  Physical Activity: Not on file  Stress: Not on file  Social Connections: Not on file  Intimate Partner Violence: Not on file   PHYSICAL EXAM  Vitals:   02/17/24 1111  BP: 124/80  Pulse: 82  Weight: (!) 301 lb 3.2 oz (136.6 kg)  Height: 5' 7 (1.702 m)   Body  mass index is 47.17 kg/m.  Generalized: Well developed, in no acute distress  Neurological examination  Mentation: Alert oriented to time, place, history taking. Follows all commands speech and language fluent Cranial nerve II-XII: Pupils were equal round reactive to light. Extraocular movements were full, visual field were full on confrontational test. Facial sensation and strength were normal.  Head turning and shoulder shrug  were normal and symmetric. Motor: The motor testing reveals 5 over 5 strength of all 4 extremities. Good symmetric motor tone is noted throughout.  Sensory: Sensory testing is intact to soft touch on all 4 extremities. No evidence of extinction is noted.  Coordination: Cerebellar testing reveals good finger-nose-finger and heel-to-shin bilaterally.  Gait and station: Gait is normal.   DIAGNOSTIC DATA (LABS, IMAGING, TESTING) - I reviewed patient records, labs, notes, testing and  imaging myself where available.  Lab Results  Component Value Date   WBC 6.8 12/20/2016   HGB 12.2 12/20/2016   HCT 36.5 12/20/2016   MCV 87.1 12/20/2016   PLT 218 12/20/2016      Component Value Date/Time   NA 141 12/20/2016 1353   K 3.9 12/20/2016 1353   CL 105 12/20/2016 1353   CO2 26 12/20/2016 1353   GLUCOSE 94 12/20/2016 1353   BUN 12 12/20/2016 1353   CREATININE 0.77 12/20/2016 1353   CALCIUM 9.5 12/20/2016 1353   PROT 8.0 11/07/2016 0042   ALBUMIN 4.0 11/07/2016 0042   AST 17 11/07/2016 0042   ALT 16 11/07/2016 0042   ALKPHOS 86 11/07/2016 0042   BILITOT 1.1 11/07/2016 0042   GFRNONAA >60 12/20/2016 1353   GFRAA >60 12/20/2016 1353   No results found for: CHOL, HDL, LDLCALC, LDLDIRECT, TRIG, CHOLHDL No results found for: YHAJ8R No results found for: [id number] Lab Results  Component Value Date   TSH 2.260 01/31/2022    Lauraine Born, AGNP-C, DNP 02/17/2024, 11:36 AM [facility] [address] [place], [place]  [address] [phone]

## 2024-02-17 ENCOUNTER — Ambulatory Visit (INDEPENDENT_AMBULATORY_CARE_PROVIDER_SITE_OTHER): Payer: BC Managed Care – PPO | Admitting: Neurology

## 2024-02-17 ENCOUNTER — Encounter: Payer: Self-pay | Admitting: Neurology

## 2024-02-17 VITALS — BP 124/80 | HR 82 | Ht 67.0 in | Wt 301.2 lb

## 2024-02-17 DIAGNOSIS — G43109 Migraine with aura, not intractable, without status migrainosus: Secondary | ICD-10-CM

## 2024-02-17 MED ORDER — QULIPTA 60 MG PO TABS
60.0000 mg | ORAL_TABLET | Freq: Every day | ORAL | 11 refills | Status: AC
Start: 2024-02-17 — End: ?

## 2024-02-17 NOTE — Patient Instructions (Signed)
 Stop Nurtec, switch to Qulipta  60 mg daily for migraine prevention. Continue Aleve  as needed.  There is increased risk for stroke in women with migraine with aura and a contraindication for the combined contraceptive pill for use by women who have migraine with aura. The risk for women with migraine without aura is lower. However other risk factors like smoking are far more likely to increase stroke risk than migraine. There is a recommendation for no smoking and for the use of OCPs without estrogen such as progestogen only pills particularly for women with migraine with aura.SABRA People who have migraine headaches with auras may be 3 times more likely to have a stroke caused by a blood clot, compared to migraine patients who don't see auras. Women who take hormone-replacement therapy may be 30 percent more likely to suffer a clot-based stroke than women not taking medication containing estrogen. Other risk factors like smoking and high blood pressure may be  much more important. And stroke is still a rare complication due to migraine aura and is controversial and lower doses may not cause a risk.

## 2024-09-29 ENCOUNTER — Telehealth: Admitting: Neurology
